# Patient Record
Sex: Male | Born: 1950 | Race: White | Hispanic: No | Marital: Married | Smoking: Never smoker
Health system: Southern US, Community
[De-identification: ages and names within clinical notes are randomized; demographics above are authoritative.]

## PROBLEM LIST (undated history)

## (undated) DIAGNOSIS — I10 Essential (primary) hypertension: Secondary | ICD-10-CM

## (undated) DIAGNOSIS — G5600 Carpal tunnel syndrome, unspecified upper limb: Secondary | ICD-10-CM

## (undated) DIAGNOSIS — E785 Hyperlipidemia, unspecified: Secondary | ICD-10-CM

## (undated) DIAGNOSIS — G4730 Sleep apnea, unspecified: Secondary | ICD-10-CM

## (undated) DIAGNOSIS — M51369 Other intervertebral disc degeneration, lumbar region without mention of lumbar back pain or lower extremity pain: Secondary | ICD-10-CM

## (undated) DIAGNOSIS — N200 Calculus of kidney: Secondary | ICD-10-CM

## (undated) DIAGNOSIS — M5136 Other intervertebral disc degeneration, lumbar region: Secondary | ICD-10-CM

## (undated) DIAGNOSIS — G4733 Obstructive sleep apnea (adult) (pediatric): Secondary | ICD-10-CM

## (undated) DIAGNOSIS — R739 Hyperglycemia, unspecified: Secondary | ICD-10-CM

## (undated) DIAGNOSIS — R972 Elevated prostate specific antigen [PSA]: Secondary | ICD-10-CM

## (undated) HISTORY — PX: CATARACT EXTRACTION: SUR2

## (undated) HISTORY — DX: Obstructive sleep apnea (adult) (pediatric): G47.33

## (undated) HISTORY — PX: CHOLECYSTECTOMY: SHX55

## (undated) HISTORY — PX: SPINE SURGERY: SHX786

## (undated) HISTORY — DX: Elevated prostate specific antigen (PSA): R97.20

## (undated) HISTORY — DX: Sleep apnea, unspecified: G47.30

## (undated) HISTORY — DX: Hyperlipidemia, unspecified: E78.5

## (undated) HISTORY — DX: Other intervertebral disc degeneration, lumbar region without mention of lumbar back pain or lower extremity pain: M51.369

## (undated) HISTORY — DX: Other intervertebral disc degeneration, lumbar region: M51.36

## (undated) HISTORY — DX: Carpal tunnel syndrome, unspecified upper limb: G56.00

## (undated) HISTORY — DX: Calculus of kidney: N20.0

## (undated) HISTORY — PX: HERNIA REPAIR: SHX51

## (undated) HISTORY — DX: Hyperglycemia, unspecified: R73.9

---

## 2007-02-24 ENCOUNTER — Ambulatory Visit: Payer: Self-pay | Admitting: Internal Medicine

## 2007-09-30 ENCOUNTER — Ambulatory Visit: Payer: Self-pay | Admitting: Internal Medicine

## 2010-09-01 ENCOUNTER — Ambulatory Visit: Payer: Self-pay | Admitting: Internal Medicine

## 2015-02-13 ENCOUNTER — Emergency Department
Admission: EM | Admit: 2015-02-13 | Discharge: 2015-02-14 | Disposition: A | Discharge status: Home / Self Care | Payer: Medicare Other | Attending: Emergency Medicine | Admitting: Emergency Medicine

## 2015-02-13 DIAGNOSIS — R55 Syncope and collapse: Secondary | ICD-10-CM | POA: Diagnosis present

## 2015-02-13 DIAGNOSIS — I10 Essential (primary) hypertension: Secondary | ICD-10-CM | POA: Insufficient documentation

## 2015-02-13 DIAGNOSIS — E860 Dehydration: Secondary | ICD-10-CM | POA: Diagnosis not present

## 2015-02-13 HISTORY — DX: Essential (primary) hypertension: I10

## 2015-02-13 LAB — BASIC METABOLIC PANEL
ANION GAP: 9 (ref 5–15)
BUN: 23 mg/dL — ABNORMAL HIGH (ref 6–20)
CALCIUM: 9 mg/dL (ref 8.9–10.3)
CO2: 23 mmol/L (ref 22–32)
Chloride: 108 mmol/L (ref 101–111)
Creatinine, Ser: 1.14 mg/dL (ref 0.61–1.24)
GLUCOSE: 118 mg/dL — AB (ref 65–99)
Potassium: 3.5 mmol/L (ref 3.5–5.1)
SODIUM: 140 mmol/L (ref 135–145)

## 2015-02-13 LAB — CBC
HCT: 40.5 % (ref 40.0–52.0)
Hemoglobin: 13.7 g/dL (ref 13.0–18.0)
MCH: 29.3 pg (ref 26.0–34.0)
MCHC: 33.9 g/dL (ref 32.0–36.0)
MCV: 86.4 fL (ref 80.0–100.0)
PLATELETS: 225 10*3/uL (ref 150–440)
RBC: 4.69 MIL/uL (ref 4.40–5.90)
RDW: 13.3 % (ref 11.5–14.5)
WBC: 8 10*3/uL (ref 3.8–10.6)

## 2015-02-13 LAB — TROPONIN I: Troponin I: <0.03 ng/mL (ref <0.031)

## 2015-02-13 LAB — GLUCOSE, CAPILLARY: Glucose-Capillary: 115 mg/dL — ABNORMAL HIGH (ref 65–99)

## 2015-02-13 MED ORDER — SODIUM CHLORIDE 0.9 % IV BOLUS (SEPSIS)
1000.0000 mL | Freq: Once | INTRAVENOUS | Status: AC
  Administered 2015-02-13: 1000 mL via INTRAVENOUS

## 2015-02-13 NOTE — ED Provider Notes (Addendum)
[facility] Emergency Department Provider Note  ____________________________________________  Time seen: Approximately 9:28 PM  I have reviewed the triage vital signs and the nursing notes.   HISTORY  Chief Complaint Loss of Consciousness  Offered patient a telemetry interpreter, he reports that he is lived in [place] for over 20 years and that he is very comfortable speaking English and does not wish interpreter services. He does seem to have good comprehension. Does appear to speak English quite fluently.  HPI Robert Grimes is a 65 y.o. male history of hypertension on lisinopril.  Patient presents today for evaluation after an episode of syncope. Family reports that the patient passed out at the dining room table briefly. He fell back into his chair and did lose consciousness for about 1-2 minutes, but regained consciousness while seated in the chair without fall or injury. He did not have any weakness except for feeling generally very tired, note droop in the face, no trouble speaking, no numbness weakness in the arms or legs.  The patient reports that he was out in the yard today, he is not staying well-hydrated and felt sweaty during his yard work. He came into the shower and he remembers sitting down and beginning dinner, when he then woke up with family having standing over him evidently hadn't passed out.  He denies any headache, neck pain, chest pain, fever or chills, trouble breathing or abdominal pain or other concerns. In the emergency room he reports that he feels "just fine".  His mother had a heart attack in her 59s, but no history of early onset coronary disease.  Past Medical History  Diagnosis Date  . Hypertension     There are no active problems to display for this patient.   History reviewed. No pertinent past surgical history.  No current outpatient prescriptions on file.  Allergies Review of patient's allergies indicates no  known allergies.  No family history on file.  Social History Social History  Substance Use Topics  . Smoking status: Never Smoker   . Smokeless tobacco: None  . Alcohol Use: No    Review of Systems Constitutional: No fever/chills Eyes: No visual changes. ENT: No sore throat. Cardiovascular: Denies chest pain. Respiratory: Denies shortness of breath. Gastrointestinal: No abdominal pain.  No nausea, no vomiting.  No diarrhea.  No constipation. Genitourinary: Negative for dysuria. Musculoskeletal: Negative for back pain. Skin: Negative for rash. Neurological: Negative for headaches, focal weakness or numbness.  10-point ROS otherwise negative.  ____________________________________________   PHYSICAL EXAM:  VITAL SIGNS: ED Triage Vitals  Enc Vitals Group     BP 02/13/15 1930 125/76 mmHg     Pulse Rate 02/13/15 1930 80     Resp 02/13/15 1930 23     Temp 02/13/15 1921 97.6 F (36.4 C)     Temp src --      SpO2 02/13/15 1930 100 %     Weight 02/13/15 1921 176 lb (79.833 kg)     Height 02/13/15 1921 5\' 3"  (1.6 m)     Head Cir --      Peak Flow --      Pain Score --      Pain Loc --      Pain Edu? --      Excl. in GC? --    Constitutional: Alert and oriented. Well appearing and in no acute distress. Eyes: Conjunctivae are normal. PERRL. EOMI. Head: Atraumatic. Nose: No congestion/rhinnorhea. Mouth/Throat: Mucous membranes are slightly dry.  Oropharynx  non-erythematous. Neck: No stridor.   Cardiovascular: Normal rate, regular rhythm. Grossly normal heart sounds.  Good peripheral circulation. Respiratory: Normal respiratory effort.  No retractions. Lungs CTAB. Gastrointestinal: Soft and nontender. No distention. No abdominal bruits. No CVA tenderness. No midline abdominal or palpable mass. Musculoskeletal: No lower extremity tenderness nor edema.  No joint effusions. Neurologic:  Normal speech and language. No gross focal neurologic deficits are appreciated.    Detailed neuro, performed by me at bedside. The patient has no pronator drift. The patient has normal cranial nerve exam. Extraocular movements are normal. Visual fields are normal. Patient has 5 out of 5 strength in all extremities. There is no numbness or gross, acute sensory abnormality in the extremities bilaterally. No speech disturbance. No dysarthria. No aphasia. No ataxia. Normal finger nose finger bilat. Patient speaking in full and clear sentences.   Skin:  Skin is warm, dry and intact. No rash noted. Psychiatric: Mood and affect are normal. Speech and behavior are normal.  ____________________________________________   LABS (all labs ordered are listed, but only abnormal results are displayed)  Labs Reviewed  BASIC METABOLIC PANEL - Abnormal; Notable for the following:    Glucose, Bld 118 (*)    BUN 23 (*)    All other components within normal limits  GLUCOSE, CAPILLARY - Abnormal; Notable for the following:    Glucose-Capillary 115 (*)    All other components within normal limits  CBC  TROPONIN I  TROPONIN I  CBG MONITORING, ED   ____________________________________________  EKG  ED ECG REPORT I, Capria Cartaya, the attending physician, personally viewed and interpreted this ECG.  Date: 02/13/2015 EKG Time: 1930 Rate: 70 Rhythm: normal sinus rhythm QRS Axis: normal Intervals: normal ST/T Wave abnormalities: normal Conduction Disturbances: none Narrative Interpretation: unremarkable  ____________________________________________  RADIOLOGY  I find no indication for acute imaging. Patient currently asymptomatic, completely intact neuro exam. Normal pulmonary exam, oxygen saturation, and no chest pain. ____________________________________________   PROCEDURES  Procedure(s) performed: None  Critical Care performed: No  ____________________________________________   INITIAL IMPRESSION / ASSESSMENT AND PLAN / ED COURSE  Pertinent labs &  imaging results that were available during my care of the patient were reviewed by me and considered in my medical decision making (see chart for details).  Patient rinse after syncope, with complete and full neurologic recovery. No associated cardiopulmonary symptoms. His EKG is reassuring and first troponin is negative. He is asymptomatic in the ER. History seems most suggestive patient was likely slightly dehydrated after day working in the yard. I find his exam presently to be quite reassuring along with his labs and EKG. No indication for CT imaging of the head. Patient does not meet any high risk syncope criteria by [facility] score.  ----------------------------------------- 10:15 PM on 02/13/2015 -----------------------------------------  Patient is awake alert no distress. He reports asymptomatic, currently drinking water and feeling well. Plan to check a second troponin and observe him based on his age, and plan to discharge him if second troponin is normal and no events or new concerns arise during observation till about midnight. Ongoing care and disposition including follow-up on second troponin and reevaluation assigned to Dr. Derrill Kay at 10:30 PM.  I did discuss return precautions with the patient and his family who are very agreeable the event that his observation period is normal and he is discharged home. ____________________________________________   FINAL CLINICAL IMPRESSION(S) / ED DIAGNOSES  Final diagnoses:  Dehydration  Syncope and collapse      Sharyn Creamer,  MD 02/13/15 2218  ----------------------------------------- 11:38 PM on 02/13/2015 ----------------------------------------- Patient signed out to Dr. Dolores Frame. A third troponin is sent as the second was not appropriately times. If negative, reevaluate and plan to discharge home.   Sharyn Creamer, MD 02/13/15 [phone]

## 2015-02-13 NOTE — Discharge Instructions (Signed)
You have been seen today in the Emergency Department (ED)  for syncope (passing out).  Your workup including labs and EKG show reassuring results.  Your symptoms may be due to dehydration, so it is important that you drink plenty of non-alcoholic fluids.  Please call your regular doctor as soon as possible to schedule the next available clinic appointment to follow up with him/her regarding your visit to the ED and your symptoms.  Return to the Emergency Department (ED)  if you have any further syncopal episodes (pass out again) or develop ANY chest pain, pressure, tightness, trouble breathing, sudden sweating, ANY abdominal pain, or other symptoms that concern you.    Syncope Syncope is a medical term for fainting or passing out. This means you lose consciousness and drop to the ground. People are generally unconscious for less than 5 minutes. You may have some muscle twitches for up to 15 seconds before waking up and returning to normal. Syncope occurs more often in older adults, but it can happen to anyone. While most causes of syncope are not dangerous, syncope can be a sign of a serious medical problem. It is important to seek medical care.  CAUSES  Syncope is caused by a sudden drop in blood flow to the brain. The specific cause is often not determined. Factors that can bring on syncope include:  Taking medicines that lower blood pressure.  Sudden changes in posture, such as standing up quickly.  Taking more medicine than prescribed.  Standing in one place for too long.  Seizure disorders.  Dehydration and excessive exposure to heat.  Low blood sugar (hypoglycemia).  Straining to have a bowel movement.  Heart disease, irregular heartbeat, or other circulatory problems.  Fear, emotional distress, seeing blood, or severe pain. SYMPTOMS  Right before fainting, you may:  Feel dizzy or light-headed.  Feel nauseous.  See all white or all black in your field of vision.  Have  cold, clammy skin. DIAGNOSIS  Your health care provider will ask about your symptoms, perform a physical exam, and perform an electrocardiogram (ECG) to record the electrical activity of your heart. Your health care provider may also perform other heart or blood tests to determine the cause of your syncope which may include:  Transthoracic echocardiogram (TTE). During echocardiography, sound waves are used to evaluate how blood flows through your heart.  Transesophageal echocardiogram (TEE).  Cardiac monitoring. This allows your health care provider to monitor your heart rate and rhythm in real time.  Holter monitor. This is a portable device that records your heartbeat and can help diagnose heart arrhythmias. It allows your health care provider to track your heart activity for several days, if needed.  Stress tests by exercise or by giving medicine that makes the heart beat faster. TREATMENT  In most cases, no treatment is needed. Depending on the cause of your syncope, your health care provider may recommend changing or stopping some of your medicines. HOME CARE INSTRUCTIONS  Have someone stay with you until you feel stable.  Do not drive, use machinery, or play sports until your health care provider says it is okay.  Keep all follow-up appointments as directed by your health care provider.  Lie down right away if you start feeling like you might faint. Breathe deeply and steadily. Wait until all the symptoms have passed.  Drink enough fluids to keep your urine clear or pale yellow.  If you are taking blood pressure or heart medicine, get up slowly and take several  minutes to sit and then stand. This can reduce dizziness. SEEK IMMEDIATE MEDICAL CARE IF:   You have a severe headache.  You have unusual pain in the chest, abdomen, or back.  You are bleeding from your mouth or rectum, or you have black or tarry stool.  You have an irregular or very fast heartbeat.  You have pain  with breathing.  You have repeated fainting or seizure-like jerking during an episode.  You faint when sitting or lying down.  You have confusion.  You have trouble walking.  You have severe weakness.  You have vision problems. If you fainted, call your local emergency services (911 in [place] Do not drive yourself to the hospital.    This information is not intended to replace advice given to you by your health care provider. Make sure you discuss any questions you have with your health care provider.   Document Released: 12/18/2004 Document Revised: 05/04/2014 Document Reviewed: 02/16/2011 Elsevier Interactive Patient Education [employer]

## 2015-02-13 NOTE — ED Notes (Signed)
PT from home via EMS, was outside working all day when we went inside to eat he had a syncopal episode, reports his legs were cramping and had dizziness

## 2015-02-14 LAB — TROPONIN I: TROPONIN I: 0.03 ng/mL (ref <0.031)

## 2015-02-14 NOTE — ED Notes (Signed)
Family at bedside. 

## 2015-02-14 NOTE — ED Notes (Signed)
MD at bedside. 

## 2015-02-14 NOTE — ED Provider Notes (Signed)
-----------------------------------------   12:50 AM on 02/14/2015 -----------------------------------------  Third troponin remains negative. Updated patient and family. Patient to have close follow-up with his PCP within the next 2-3 days. Strict return precautions given. All verbalize understanding and agree with plan of care.  Irean Hong, MD 02/14/15 [phone]

## 2015-02-17 ENCOUNTER — Other Ambulatory Visit: Payer: Self-pay | Admitting: Internal Medicine

## 2015-02-17 DIAGNOSIS — I10 Essential (primary) hypertension: Secondary | ICD-10-CM

## 2015-02-17 DIAGNOSIS — R55 Syncope and collapse: Secondary | ICD-10-CM

## 2015-02-22 ENCOUNTER — Ambulatory Visit
Admission: RE | Admit: 2015-02-22 | Discharge: 2015-02-22 | Disposition: A | Discharge status: Home / Self Care | Payer: Medicare Other | Source: Ambulatory Visit | Attending: Internal Medicine | Admitting: Internal Medicine

## 2015-02-22 DIAGNOSIS — R55 Syncope and collapse: Secondary | ICD-10-CM

## 2015-02-22 DIAGNOSIS — I2510 Atherosclerotic heart disease of native coronary artery without angina pectoris: Secondary | ICD-10-CM | POA: Insufficient documentation

## 2015-02-22 DIAGNOSIS — I708 Atherosclerosis of other arteries: Secondary | ICD-10-CM | POA: Diagnosis not present

## 2015-02-22 DIAGNOSIS — I10 Essential (primary) hypertension: Secondary | ICD-10-CM | POA: Insufficient documentation

## 2016-12-03 ENCOUNTER — Other Ambulatory Visit: Payer: Self-pay

## 2016-12-17 ENCOUNTER — Ambulatory Visit: Payer: Self-pay

## 2017-12-12 IMAGING — US US CAROTID DUPLEX BILAT
1 series · 13 of 24 positions shown · non-contrast
Comparison: None.

CLINICAL DATA: Syncopal episode.  History of hypertension.

EXAM:
BILATERAL CAROTID DUPLEX ULTRASOUND
TECHNIQUE: Gray scale imaging, color Doppler and duplex ultrasound were
performed of bilateral carotid and vertebral arteries in the neck.

[Series 1: us carotid duplex bilat · 13 of 62 slices shown]
[im 1/62]
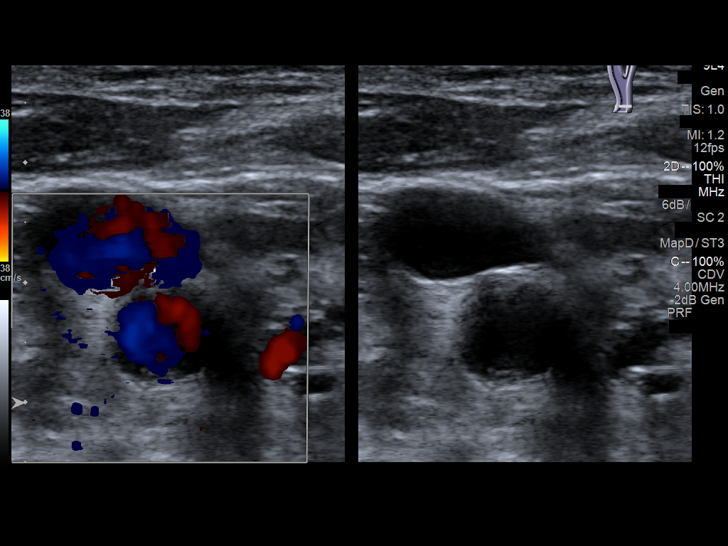
[im 6/62]
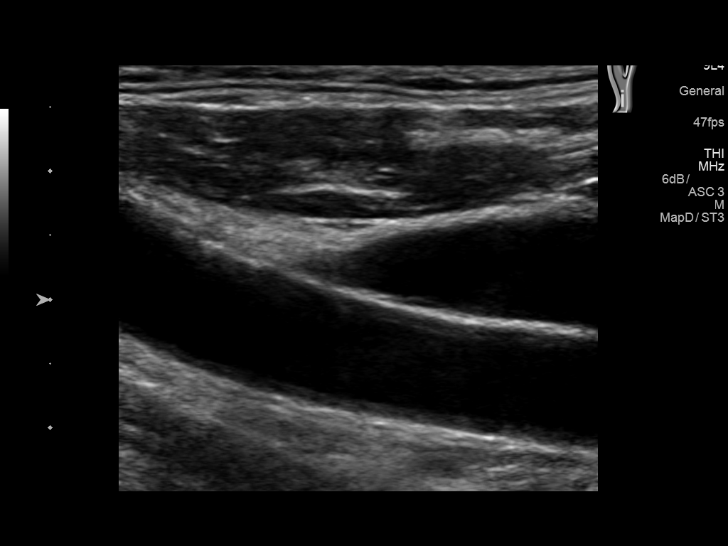
[im 11/62]
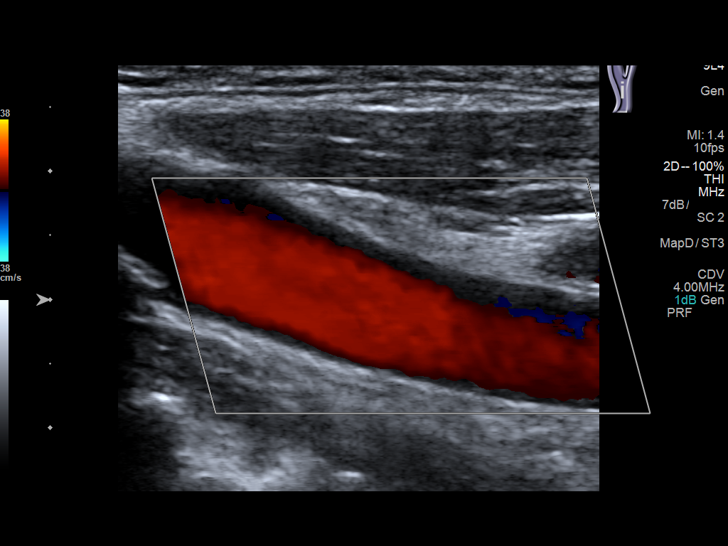
[im 16/62]
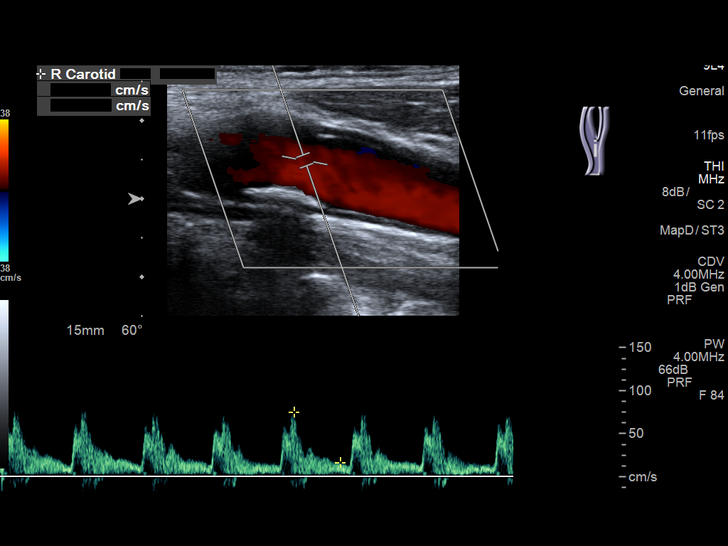
[im 22/62]
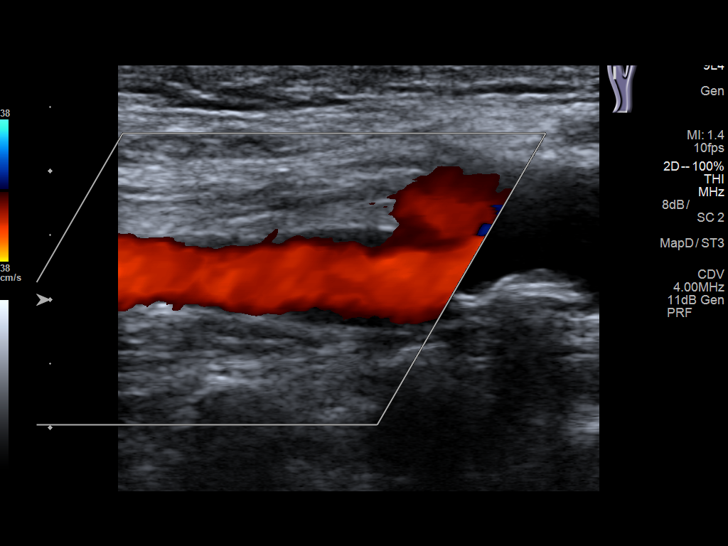
[im 27/62]
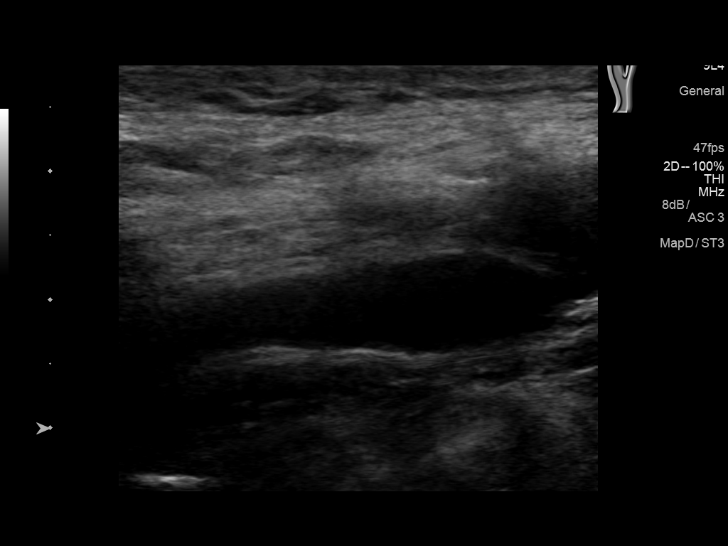
[im 32/62]
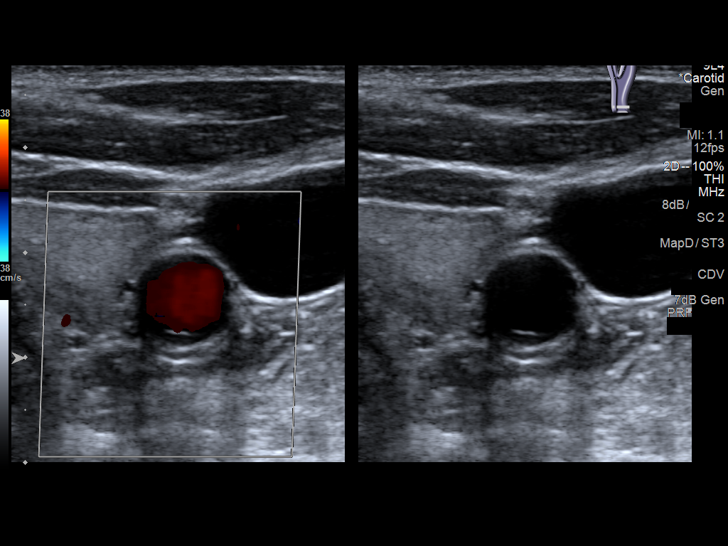
[im 35/62]
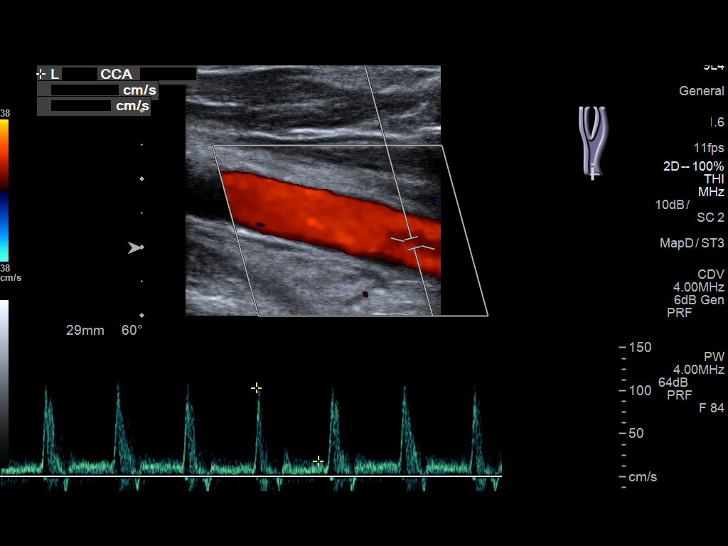
[im 40/62]
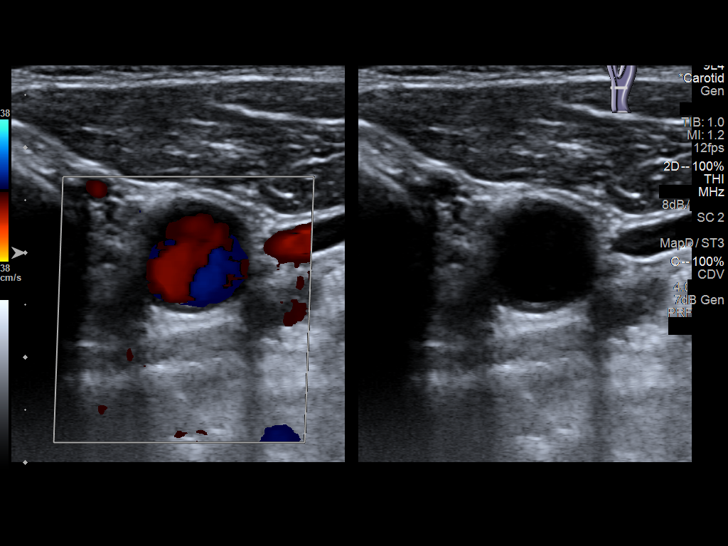
[im 46/62]
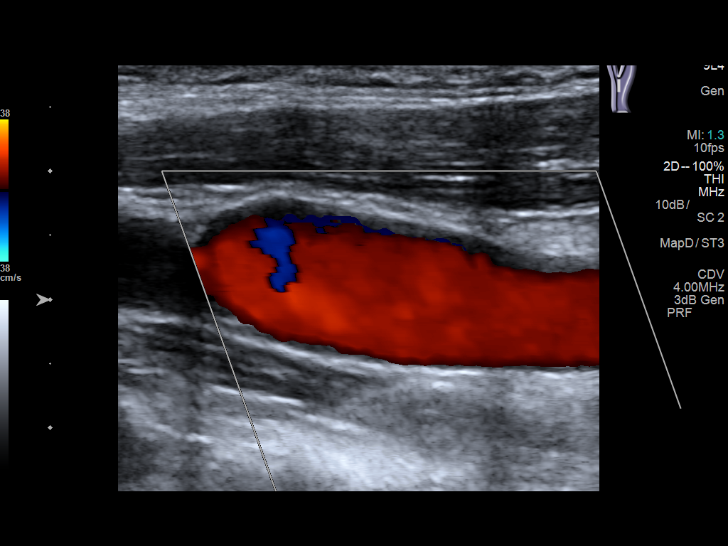
[im 51/62]
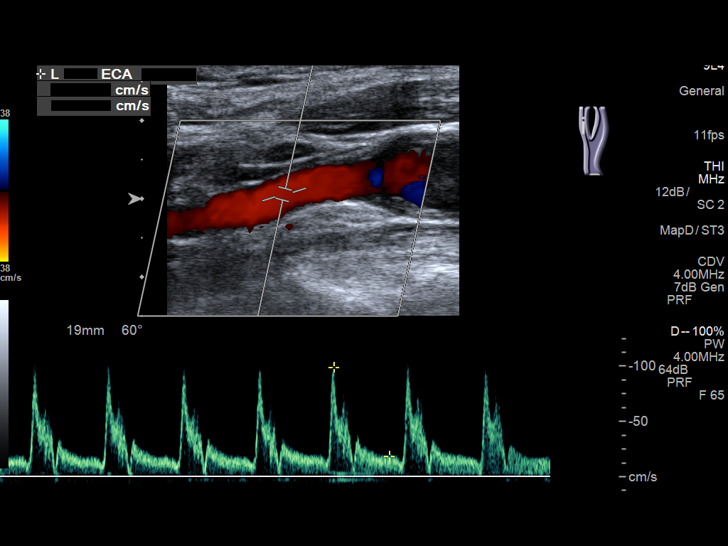
[im 56/62]
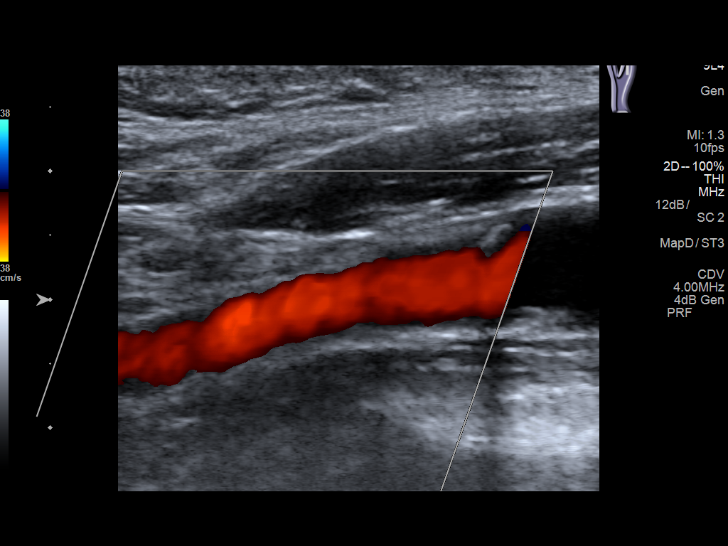
[im 62/62]
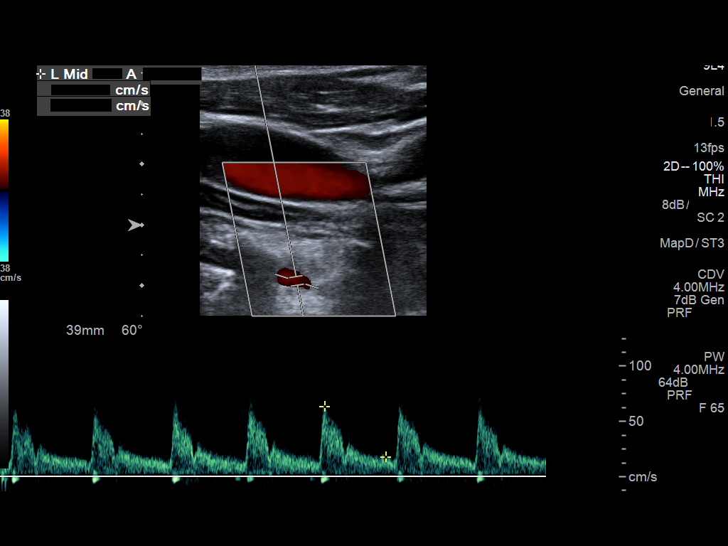

[13 of 24 positions shown; findings below may reference images not displayed]

FINDINGS: Criteria: Quantification of carotid stenosis is based on velocity
parameters that correlate the residual internal carotid diameter
with NASCET-based stenosis levels, using the diameter of the distal
internal carotid lumen as the denominator for stenosis measurement.

The following velocity measurements were obtained:

RIGHT

ICA:  66/23 cm/sec

CCA:  97/17 cm/sec

SYSTOLIC ICA/CCA RATIO:

DIASTOLIC ICA/CCA RATIO:

ECA:  111 cm/sec

LEFT

ICA:  84/32 cm/sec

CCA:  90/21 cm/sec

SYSTOLIC ICA/CCA RATIO:

DIASTOLIC ICA/CCA RATIO:

ECA:  99 cm/sec

RIGHT CAROTID ARTERY: There is a minimal amount of eccentric mixed
echogenic partially shadowing plaque within the right carotid bulb
(images 15 and 16), extending to involve the origin and proximal
aspects of the right internal carotid artery (image 23), not
resulting in elevated peak systolic velocities within the
interrogated course of the right internal carotid artery to suggest
a hemodynamically significant stenosis.

RIGHT VERTEBRAL ARTERY:  Antegrade flow

LEFT CAROTID ARTERY: There is a minimal amount of extended mixed
echogenic plaque within the left carotid bulb (image 46 and 47),
extending to involve the origin and proximal aspects of the left
internal carotid artery (image 54), not resulting in elevated peak
systolic velocities within the interrogated course of the left
internal carotid artery to suggest a hemodynamically significant
stenosis.

LEFT VERTEBRAL ARTERY:  Antegrade flow
IMPRESSION: Minimal amount of bilateral atherosclerotic plaque, right greater
than left, not resulting in a hemodynamically significant stenosis
within either internal carotid artery.

## 2019-01-19 DIAGNOSIS — I10 Essential (primary) hypertension: Secondary | ICD-10-CM | POA: Diagnosis not present

## 2019-02-07 ENCOUNTER — Ambulatory Visit: Payer: Medicare Other | Attending: Internal Medicine

## 2019-02-07 DIAGNOSIS — Z23 Encounter for immunization: Secondary | ICD-10-CM | POA: Insufficient documentation

## 2019-02-07 NOTE — Progress Notes (Signed)
Covid-19 Vaccination Clinic  Name:  Robert Grimes    MRN: [id number] DOB: 05-27-1950  02/07/2019  [name] was observed post Covid-19 immunization for 15 minutes without incidence. He was provided with Vaccine Information Sheet and instruction to access the V-Safe system.   [name] was instructed to call 911 with any severe reactions post vaccine: Marland Kitchen Difficulty breathing  . Swelling of your face and throat  . A fast heartbeat  . A bad rash all over your body  . Dizziness and weakness    Immunizations Administered    Name Date Dose VIS Date Route   Pfizer COVID-19 Vaccine 02/07/2019  1:13 PM 0.3 mL 12/12/2018 Intramuscular   Manufacturer: [employer], [employer]   Lot: [id number]   NDC: [address]-8

## 2019-03-03 ENCOUNTER — Ambulatory Visit: Payer: PPO | Attending: Internal Medicine

## 2019-03-03 DIAGNOSIS — Z23 Encounter for immunization: Secondary | ICD-10-CM | POA: Insufficient documentation

## 2019-03-03 NOTE — Progress Notes (Signed)
Covid-19 Vaccination Clinic  Name:  Robert Grimes    MRN: [id number] DOB: Jan 26, 1950  03/03/2019  [name] was observed post Covid-19 immunization for 15 minutes without incident. He was provided with Vaccine Information Sheet and instruction to access the V-Safe system.   [name] was instructed to call 911 with any severe reactions post vaccine: Marland Kitchen Difficulty breathing  . Swelling of face and throat  . A fast heartbeat  . A bad rash all over body  . Dizziness and weakness   Immunizations Administered    Name Date Dose VIS Date Route   Pfizer COVID-19 Vaccine 03/03/2019 10:39 AM 0.3 mL 12/12/2018 Intramuscular   Manufacturer: [employer], [employer]   Lot: [id number]   NDC: [address]-2

## 2019-03-24 DIAGNOSIS — G4733 Obstructive sleep apnea (adult) (pediatric): Secondary | ICD-10-CM | POA: Diagnosis not present

## 2019-03-24 DIAGNOSIS — R739 Hyperglycemia, unspecified: Secondary | ICD-10-CM | POA: Diagnosis not present

## 2019-03-24 DIAGNOSIS — R972 Elevated prostate specific antigen [PSA]: Secondary | ICD-10-CM | POA: Diagnosis not present

## 2019-03-24 DIAGNOSIS — M5136 Other intervertebral disc degeneration, lumbar region: Secondary | ICD-10-CM | POA: Diagnosis not present

## 2019-03-24 DIAGNOSIS — I10 Essential (primary) hypertension: Secondary | ICD-10-CM | POA: Diagnosis not present

## 2019-03-24 DIAGNOSIS — E7849 Other hyperlipidemia: Secondary | ICD-10-CM | POA: Diagnosis not present

## 2019-03-31 DIAGNOSIS — M5136 Other intervertebral disc degeneration, lumbar region: Secondary | ICD-10-CM | POA: Diagnosis not present

## 2019-03-31 DIAGNOSIS — Z0000 Encounter for general adult medical examination without abnormal findings: Secondary | ICD-10-CM | POA: Diagnosis not present

## 2019-03-31 DIAGNOSIS — R972 Elevated prostate specific antigen [PSA]: Secondary | ICD-10-CM | POA: Diagnosis not present

## 2019-03-31 DIAGNOSIS — I10 Essential (primary) hypertension: Secondary | ICD-10-CM | POA: Diagnosis not present

## 2019-03-31 DIAGNOSIS — G4733 Obstructive sleep apnea (adult) (pediatric): Secondary | ICD-10-CM | POA: Diagnosis not present

## 2019-03-31 DIAGNOSIS — E039 Hypothyroidism, unspecified: Secondary | ICD-10-CM | POA: Diagnosis not present

## 2019-03-31 DIAGNOSIS — E7849 Other hyperlipidemia: Secondary | ICD-10-CM | POA: Diagnosis not present

## 2019-03-31 DIAGNOSIS — R739 Hyperglycemia, unspecified: Secondary | ICD-10-CM | POA: Diagnosis not present

## 2019-04-21 DIAGNOSIS — E039 Hypothyroidism, unspecified: Secondary | ICD-10-CM | POA: Diagnosis not present

## 2019-04-28 DIAGNOSIS — R972 Elevated prostate specific antigen [PSA]: Secondary | ICD-10-CM | POA: Diagnosis not present

## 2019-04-28 DIAGNOSIS — E7849 Other hyperlipidemia: Secondary | ICD-10-CM | POA: Diagnosis not present

## 2019-04-28 DIAGNOSIS — G4733 Obstructive sleep apnea (adult) (pediatric): Secondary | ICD-10-CM | POA: Diagnosis not present

## 2019-04-28 DIAGNOSIS — R739 Hyperglycemia, unspecified: Secondary | ICD-10-CM | POA: Diagnosis not present

## 2019-04-28 DIAGNOSIS — M5136 Other intervertebral disc degeneration, lumbar region: Secondary | ICD-10-CM | POA: Diagnosis not present

## 2019-04-28 DIAGNOSIS — I10 Essential (primary) hypertension: Secondary | ICD-10-CM | POA: Diagnosis not present

## 2019-10-06 DIAGNOSIS — T8522XA Displacement of intraocular lens, initial encounter: Secondary | ICD-10-CM | POA: Diagnosis not present

## 2019-10-06 DIAGNOSIS — H401113 Primary open-angle glaucoma, right eye, severe stage: Secondary | ICD-10-CM | POA: Diagnosis not present

## 2019-10-06 DIAGNOSIS — H401121 Primary open-angle glaucoma, left eye, mild stage: Secondary | ICD-10-CM | POA: Diagnosis not present

## 2019-10-20 DIAGNOSIS — M5136 Other intervertebral disc degeneration, lumbar region: Secondary | ICD-10-CM | POA: Diagnosis not present

## 2019-10-20 DIAGNOSIS — G4733 Obstructive sleep apnea (adult) (pediatric): Secondary | ICD-10-CM | POA: Diagnosis not present

## 2019-10-20 DIAGNOSIS — R972 Elevated prostate specific antigen [PSA]: Secondary | ICD-10-CM | POA: Diagnosis not present

## 2019-10-20 DIAGNOSIS — E7849 Other hyperlipidemia: Secondary | ICD-10-CM | POA: Diagnosis not present

## 2019-10-20 DIAGNOSIS — R739 Hyperglycemia, unspecified: Secondary | ICD-10-CM | POA: Diagnosis not present

## 2019-10-27 DIAGNOSIS — Z125 Encounter for screening for malignant neoplasm of prostate: Secondary | ICD-10-CM | POA: Diagnosis not present

## 2019-10-27 DIAGNOSIS — E7849 Other hyperlipidemia: Secondary | ICD-10-CM | POA: Diagnosis not present

## 2019-10-27 DIAGNOSIS — M5136 Other intervertebral disc degeneration, lumbar region: Secondary | ICD-10-CM | POA: Diagnosis not present

## 2019-10-27 DIAGNOSIS — Z1211 Encounter for screening for malignant neoplasm of colon: Secondary | ICD-10-CM | POA: Diagnosis not present

## 2019-10-27 DIAGNOSIS — G4733 Obstructive sleep apnea (adult) (pediatric): Secondary | ICD-10-CM | POA: Diagnosis not present

## 2019-10-27 DIAGNOSIS — R972 Elevated prostate specific antigen [PSA]: Secondary | ICD-10-CM | POA: Diagnosis not present

## 2019-10-27 DIAGNOSIS — I10 Essential (primary) hypertension: Secondary | ICD-10-CM | POA: Diagnosis not present

## 2019-10-27 DIAGNOSIS — R739 Hyperglycemia, unspecified: Secondary | ICD-10-CM | POA: Diagnosis not present

## 2019-11-16 DIAGNOSIS — H401121 Primary open-angle glaucoma, left eye, mild stage: Secondary | ICD-10-CM | POA: Diagnosis not present

## 2019-11-16 DIAGNOSIS — Z1211 Encounter for screening for malignant neoplasm of colon: Secondary | ICD-10-CM | POA: Diagnosis not present

## 2019-11-16 DIAGNOSIS — H401113 Primary open-angle glaucoma, right eye, severe stage: Secondary | ICD-10-CM | POA: Diagnosis not present

## 2019-11-24 LAB — COLOGUARD: COLOGUARD: NEGATIVE

## 2019-12-14 ENCOUNTER — Other Ambulatory Visit: Payer: Self-pay

## 2019-12-14 ENCOUNTER — Encounter: Payer: Self-pay | Admitting: Urology

## 2019-12-14 ENCOUNTER — Ambulatory Visit: Payer: PPO | Admitting: Urology

## 2019-12-14 VITALS — BP 171/90 | HR 83 | Ht 62.0 in | Wt 166.0 lb

## 2019-12-14 DIAGNOSIS — R350 Frequency of micturition: Secondary | ICD-10-CM

## 2019-12-14 DIAGNOSIS — R972 Elevated prostate specific antigen [PSA]: Secondary | ICD-10-CM | POA: Diagnosis not present

## 2019-12-14 DIAGNOSIS — N401 Enlarged prostate with lower urinary tract symptoms: Secondary | ICD-10-CM | POA: Diagnosis not present

## 2019-12-14 MED ORDER — TAMSULOSIN HCL 0.4 MG PO CAPS: 0.4000 mg | ORAL_CAPSULE | Freq: Every day | ORAL | 2 refills | Status: DC

## 2019-12-14 NOTE — Progress Notes (Signed)
12/14/2019 10:47 AM   Robert Grimes [birth year] [id number]  Referring provider: Lynnea Ferrier, MD [address] [facility] [place],  [place] [address]  Chief Complaint  Patient presents with  . Elevated PSA    HPI: Robert Grimes is a 69 y.o. male seen request of Dr. Graciela Grimes for evaluation of an elevated PSA.   PSA 10/20/2019 was 7.45  I saw him Grimes [facility] 11/2013 for PSA of 5.12 and surveillance recommended  Previous prostate biopsy ~15 years ago which was benign  Does have bothersome urinary frequency  Denies dysuria, gross hematuria  No flank, abdominal or pelvic pain    PMH: Past Medical History:  Diagnosis Date  . Hypertension     Surgical History: No past surgical history on file.  Home Medications:  Allergies as of 12/14/2019   No Known Allergies     Medication List       Accurate as of December 14, 2019 10:47 AM. If you have any questions, ask your nurse or doctor.        amLODipine 2.5 MG tablet Commonly known as: NORVASC Take by mouth.   latanoprost 0.005 % ophthalmic solution Commonly known as: XALATAN   lisinopril 10 MG tablet Commonly known as: ZESTRIL Take 1 tablet by mouth daily.       Allergies: No Known Allergies  Family History: No family history on file.  Social History:  reports that he has never smoked. He has never used smokeless tobacco. He reports that he does not drink alcohol. No history on file for drug use.   Physical Exam: BP (!) 171/90   Pulse 83   Ht 5\' 2"  (1.575 m)   Wt 166 lb (75.3 kg)   BMI 30.36 kg/m   Constitutional:  Alert, No acute distress. HEENT: Robert Grimes, moist mucus membranes.  Trachea midline, no masses. Cardiovascular: No clubbing, cyanosis, or edema. Respiratory: Normal respiratory effort, no increased work of breathing. GI: Abdomen is soft, nontender, nondistended, no abdominal masses GU: Prostate 50 g, smooth without nodules Skin: No rashes, bruises or suspicious  lesions. Neurologic: Grossly intact, no focal deficits, moving all 4 extremities. Psychiatric: Normal mood and affect.   Assessment & Plan:    1.  Elevated PSA  Although PSA is a prostate cancer screening test he was informed that cancer is not the most common cause of an elevated PSA. Other potential causes including BPH and inflammation were discussed. He was informed that the only way to adequately diagnose prostate cancer would be a transrectal ultrasound and biopsy of the prostate. The procedure was discussed including potential risks of bleeding and infection/sepsis. He was also informed that a negative biopsy does not conclusively rule out the possibility that prostate cancer may be present and that continued monitoring is required. The use of newer adjunctive blood tests including PHI and 4kScore were discussed. The use of multiparametric prostate MRI was also discussed however is not typically used for initial evaluation of an elevated PSA. Continued periodic surveillance was also discussed.  Prior negative biopsy and benign DRE.  After discussing above have initially recommended a prostate MRI.  Order was entered and he will be contacted with the results  2.  BPH with urinary frequency  Symptoms are bothersome enough that he desires a trial of medical management  Rx tamsulosin was sent to pharmacy   Robert Altes, MD  [facility] [address] [place], [place] [address] [phone]

## 2019-12-30 ENCOUNTER — Other Ambulatory Visit: Payer: Self-pay | Admitting: Urology

## 2019-12-30 DIAGNOSIS — R972 Elevated prostate specific antigen [PSA]: Secondary | ICD-10-CM

## 2020-01-05 DIAGNOSIS — R109 Unspecified abdominal pain: Secondary | ICD-10-CM | POA: Diagnosis not present

## 2020-01-05 DIAGNOSIS — I10 Essential (primary) hypertension: Secondary | ICD-10-CM | POA: Diagnosis not present

## 2020-01-05 DIAGNOSIS — R55 Syncope and collapse: Secondary | ICD-10-CM | POA: Diagnosis not present

## 2020-01-18 ENCOUNTER — Ambulatory Visit: Payer: PPO

## 2020-02-01 ENCOUNTER — Ambulatory Visit: Payer: PPO

## 2020-02-08 ENCOUNTER — Ambulatory Visit
Admission: RE | Admit: 2020-02-08 | Discharge: 2020-02-08 | Disposition: A | Discharge status: Home / Self Care | Payer: PPO | Source: Ambulatory Visit | Attending: Urology | Admitting: Urology

## 2020-02-08 ENCOUNTER — Other Ambulatory Visit: Payer: Self-pay

## 2020-02-08 DIAGNOSIS — R972 Elevated prostate specific antigen [PSA]: Secondary | ICD-10-CM | POA: Diagnosis not present

## 2020-02-08 DIAGNOSIS — R590 Localized enlarged lymph nodes: Secondary | ICD-10-CM | POA: Diagnosis not present

## 2020-02-08 DIAGNOSIS — N400 Enlarged prostate without lower urinary tract symptoms: Secondary | ICD-10-CM | POA: Diagnosis not present

## 2020-02-08 MED ORDER — GADOBUTROL 1 MMOL/ML IV SOLN
7.0000 mL | Freq: Once | INTRAVENOUS | Status: AC | PRN
  Administered 2020-02-08: 7 mL via INTRAVENOUS

## 2020-02-15 ENCOUNTER — Telehealth: Payer: Self-pay | Admitting: *Deleted

## 2020-02-15 NOTE — Telephone Encounter (Signed)
Notified patient daughter in law as instructed, patient pleased. Discussed follow-up appointments, patient agrees. appt made

## 2020-02-15 NOTE — Telephone Encounter (Signed)
-----   Message from Riki Altes, MD sent at 02/14/2020  4:43 PM EST ----- Prostate MRI showed no lesions suspicious for high-grade prostate cancer.  Recommend lab visit for a repeat PSA

## 2020-02-22 ENCOUNTER — Other Ambulatory Visit
Admission: RE | Admit: 2020-02-22 | Discharge: 2020-02-22 | Disposition: A | Discharge status: Home / Self Care | Payer: PPO | Attending: Urology | Admitting: Urology

## 2020-02-22 ENCOUNTER — Encounter: Payer: Self-pay | Admitting: Urology

## 2020-02-22 ENCOUNTER — Other Ambulatory Visit: Payer: Self-pay

## 2020-02-22 DIAGNOSIS — R350 Frequency of micturition: Secondary | ICD-10-CM | POA: Diagnosis not present

## 2020-02-22 DIAGNOSIS — R972 Elevated prostate specific antigen [PSA]: Secondary | ICD-10-CM | POA: Diagnosis not present

## 2020-02-22 DIAGNOSIS — N401 Enlarged prostate with lower urinary tract symptoms: Secondary | ICD-10-CM | POA: Diagnosis not present

## 2020-02-22 LAB — PSA: Prostatic Specific Antigen: 7.49 ng/mL — ABNORMAL HIGH (ref 0.00–4.00)

## 2020-02-23 ENCOUNTER — Encounter: Payer: Self-pay | Admitting: Urology

## 2020-02-25 DIAGNOSIS — R55 Syncope and collapse: Secondary | ICD-10-CM | POA: Diagnosis not present

## 2020-02-25 DIAGNOSIS — I10 Essential (primary) hypertension: Secondary | ICD-10-CM | POA: Diagnosis not present

## 2020-02-25 DIAGNOSIS — I6523 Occlusion and stenosis of bilateral carotid arteries: Secondary | ICD-10-CM | POA: Diagnosis not present

## 2020-02-25 DIAGNOSIS — E782 Mixed hyperlipidemia: Secondary | ICD-10-CM | POA: Diagnosis not present

## 2020-03-27 ENCOUNTER — Telehealth: Payer: Self-pay | Admitting: Urology

## 2020-03-27 NOTE — Telephone Encounter (Signed)
I sent patient a MyChart message regarding repeat PSA on 02/23/2020 with options for his elevated PSA and there is a notation that it has not been read.  Please verify the message and find out if they have any questions.  Thanks

## 2020-03-29 NOTE — Telephone Encounter (Signed)
Incoming call on triage line from pt's daughter in law, who states that she is returning call. Informed her of the mychart message from Dr. Lonna Cobb per DPR. She gave verbal understanding. She states that she will speak with the patient and call office back with his decision.

## 2020-04-05 ENCOUNTER — Telehealth: Payer: Self-pay | Admitting: *Deleted

## 2020-04-05 NOTE — Telephone Encounter (Signed)
Patient daughter called in today and states her dad will be following up with his PCP for PSA lab work .

## 2020-04-25 DIAGNOSIS — G4733 Obstructive sleep apnea (adult) (pediatric): Secondary | ICD-10-CM | POA: Diagnosis not present

## 2020-04-25 DIAGNOSIS — M5136 Other intervertebral disc degeneration, lumbar region: Secondary | ICD-10-CM | POA: Diagnosis not present

## 2020-04-25 DIAGNOSIS — Z125 Encounter for screening for malignant neoplasm of prostate: Secondary | ICD-10-CM | POA: Diagnosis not present

## 2020-04-25 DIAGNOSIS — R739 Hyperglycemia, unspecified: Secondary | ICD-10-CM | POA: Diagnosis not present

## 2020-04-25 DIAGNOSIS — E7849 Other hyperlipidemia: Secondary | ICD-10-CM | POA: Diagnosis not present

## 2020-05-02 DIAGNOSIS — R972 Elevated prostate specific antigen [PSA]: Secondary | ICD-10-CM | POA: Diagnosis not present

## 2020-05-02 DIAGNOSIS — Z1211 Encounter for screening for malignant neoplasm of colon: Secondary | ICD-10-CM | POA: Diagnosis not present

## 2020-05-02 DIAGNOSIS — R739 Hyperglycemia, unspecified: Secondary | ICD-10-CM | POA: Diagnosis not present

## 2020-05-02 DIAGNOSIS — Z0000 Encounter for general adult medical examination without abnormal findings: Secondary | ICD-10-CM | POA: Diagnosis not present

## 2020-05-02 DIAGNOSIS — I10 Essential (primary) hypertension: Secondary | ICD-10-CM | POA: Diagnosis not present

## 2020-05-02 DIAGNOSIS — E7849 Other hyperlipidemia: Secondary | ICD-10-CM | POA: Diagnosis not present

## 2020-05-02 DIAGNOSIS — M5136 Other intervertebral disc degeneration, lumbar region: Secondary | ICD-10-CM | POA: Diagnosis not present

## 2020-05-02 DIAGNOSIS — G4733 Obstructive sleep apnea (adult) (pediatric): Secondary | ICD-10-CM | POA: Diagnosis not present

## 2020-05-23 DIAGNOSIS — H401113 Primary open-angle glaucoma, right eye, severe stage: Secondary | ICD-10-CM | POA: Diagnosis not present

## 2020-05-23 DIAGNOSIS — H401121 Primary open-angle glaucoma, left eye, mild stage: Secondary | ICD-10-CM | POA: Diagnosis not present

## 2020-10-27 DIAGNOSIS — E7849 Other hyperlipidemia: Secondary | ICD-10-CM | POA: Diagnosis not present

## 2020-10-27 DIAGNOSIS — I10 Essential (primary) hypertension: Secondary | ICD-10-CM | POA: Diagnosis not present

## 2020-10-27 DIAGNOSIS — R739 Hyperglycemia, unspecified: Secondary | ICD-10-CM | POA: Diagnosis not present

## 2020-11-03 DIAGNOSIS — E7849 Other hyperlipidemia: Secondary | ICD-10-CM | POA: Diagnosis not present

## 2020-11-03 DIAGNOSIS — R972 Elevated prostate specific antigen [PSA]: Secondary | ICD-10-CM | POA: Diagnosis not present

## 2020-11-03 DIAGNOSIS — R739 Hyperglycemia, unspecified: Secondary | ICD-10-CM | POA: Diagnosis not present

## 2020-11-03 DIAGNOSIS — M5136 Other intervertebral disc degeneration, lumbar region: Secondary | ICD-10-CM | POA: Diagnosis not present

## 2020-11-03 DIAGNOSIS — I10 Essential (primary) hypertension: Secondary | ICD-10-CM | POA: Diagnosis not present

## 2020-11-28 DIAGNOSIS — H401121 Primary open-angle glaucoma, left eye, mild stage: Secondary | ICD-10-CM | POA: Diagnosis not present

## 2020-11-28 DIAGNOSIS — H401113 Primary open-angle glaucoma, right eye, severe stage: Secondary | ICD-10-CM | POA: Diagnosis not present

## 2021-02-20 DIAGNOSIS — R21 Rash and other nonspecific skin eruption: Secondary | ICD-10-CM | POA: Diagnosis not present

## 2021-02-20 DIAGNOSIS — G4733 Obstructive sleep apnea (adult) (pediatric): Secondary | ICD-10-CM | POA: Diagnosis not present

## 2021-02-20 DIAGNOSIS — I10 Essential (primary) hypertension: Secondary | ICD-10-CM | POA: Diagnosis not present

## 2021-02-20 DIAGNOSIS — E7849 Other hyperlipidemia: Secondary | ICD-10-CM | POA: Diagnosis not present

## 2021-02-20 DIAGNOSIS — R739 Hyperglycemia, unspecified: Secondary | ICD-10-CM | POA: Diagnosis not present

## 2021-02-20 DIAGNOSIS — R972 Elevated prostate specific antigen [PSA]: Secondary | ICD-10-CM | POA: Diagnosis not present

## 2021-02-20 DIAGNOSIS — M5136 Other intervertebral disc degeneration, lumbar region: Secondary | ICD-10-CM | POA: Diagnosis not present

## 2021-03-01 ENCOUNTER — Other Ambulatory Visit: Payer: Self-pay | Admitting: Physical Medicine & Rehabilitation

## 2021-03-01 ENCOUNTER — Other Ambulatory Visit (HOSPITAL_COMMUNITY): Payer: Self-pay | Admitting: Physical Medicine & Rehabilitation

## 2021-03-01 DIAGNOSIS — G8929 Other chronic pain: Secondary | ICD-10-CM | POA: Diagnosis not present

## 2021-03-01 DIAGNOSIS — M5442 Lumbago with sciatica, left side: Secondary | ICD-10-CM | POA: Diagnosis not present

## 2021-03-10 ENCOUNTER — Ambulatory Visit
Admission: RE | Admit: 2021-03-10 | Discharge: 2021-03-10 | Disposition: A | Discharge status: Home / Self Care | Payer: PPO | Source: Ambulatory Visit | Attending: Physical Medicine & Rehabilitation | Admitting: Physical Medicine & Rehabilitation

## 2021-03-10 DIAGNOSIS — M5442 Lumbago with sciatica, left side: Secondary | ICD-10-CM | POA: Diagnosis not present

## 2021-03-10 DIAGNOSIS — G8929 Other chronic pain: Secondary | ICD-10-CM

## 2021-03-10 DIAGNOSIS — M48061 Spinal stenosis, lumbar region without neurogenic claudication: Secondary | ICD-10-CM | POA: Diagnosis not present

## 2021-03-21 DIAGNOSIS — M5442 Lumbago with sciatica, left side: Secondary | ICD-10-CM | POA: Diagnosis not present

## 2021-03-21 DIAGNOSIS — G8929 Other chronic pain: Secondary | ICD-10-CM | POA: Diagnosis not present

## 2021-03-21 DIAGNOSIS — M48062 Spinal stenosis, lumbar region with neurogenic claudication: Secondary | ICD-10-CM | POA: Diagnosis not present

## 2021-03-30 DIAGNOSIS — M48062 Spinal stenosis, lumbar region with neurogenic claudication: Secondary | ICD-10-CM | POA: Diagnosis not present

## 2021-04-27 DIAGNOSIS — E7849 Other hyperlipidemia: Secondary | ICD-10-CM | POA: Diagnosis not present

## 2021-04-27 DIAGNOSIS — R972 Elevated prostate specific antigen [PSA]: Secondary | ICD-10-CM | POA: Diagnosis not present

## 2021-04-27 DIAGNOSIS — M5136 Other intervertebral disc degeneration, lumbar region: Secondary | ICD-10-CM | POA: Diagnosis not present

## 2021-04-27 DIAGNOSIS — R739 Hyperglycemia, unspecified: Secondary | ICD-10-CM | POA: Diagnosis not present

## 2021-05-02 DIAGNOSIS — M48062 Spinal stenosis, lumbar region with neurogenic claudication: Secondary | ICD-10-CM | POA: Diagnosis not present

## 2021-05-02 DIAGNOSIS — M5442 Lumbago with sciatica, left side: Secondary | ICD-10-CM | POA: Diagnosis not present

## 2021-05-02 DIAGNOSIS — G8929 Other chronic pain: Secondary | ICD-10-CM | POA: Diagnosis not present

## 2021-05-04 DIAGNOSIS — G4733 Obstructive sleep apnea (adult) (pediatric): Secondary | ICD-10-CM | POA: Diagnosis not present

## 2021-05-04 DIAGNOSIS — R972 Elevated prostate specific antigen [PSA]: Secondary | ICD-10-CM | POA: Diagnosis not present

## 2021-05-04 DIAGNOSIS — E7849 Other hyperlipidemia: Secondary | ICD-10-CM | POA: Diagnosis not present

## 2021-05-04 DIAGNOSIS — M5136 Other intervertebral disc degeneration, lumbar region: Secondary | ICD-10-CM | POA: Diagnosis not present

## 2021-05-04 DIAGNOSIS — I10 Essential (primary) hypertension: Secondary | ICD-10-CM | POA: Diagnosis not present

## 2021-05-04 DIAGNOSIS — Z0000 Encounter for general adult medical examination without abnormal findings: Secondary | ICD-10-CM | POA: Diagnosis not present

## 2021-05-04 DIAGNOSIS — R739 Hyperglycemia, unspecified: Secondary | ICD-10-CM | POA: Diagnosis not present

## 2021-06-22 DIAGNOSIS — H401113 Primary open-angle glaucoma, right eye, severe stage: Secondary | ICD-10-CM | POA: Diagnosis not present

## 2021-06-22 DIAGNOSIS — H401121 Primary open-angle glaucoma, left eye, mild stage: Secondary | ICD-10-CM | POA: Diagnosis not present

## 2021-07-28 DIAGNOSIS — R739 Hyperglycemia, unspecified: Secondary | ICD-10-CM | POA: Diagnosis not present

## 2021-07-28 DIAGNOSIS — E7849 Other hyperlipidemia: Secondary | ICD-10-CM | POA: Diagnosis not present

## 2021-08-04 DIAGNOSIS — R7303 Prediabetes: Secondary | ICD-10-CM | POA: Diagnosis not present

## 2021-08-04 DIAGNOSIS — M5136 Other intervertebral disc degeneration, lumbar region: Secondary | ICD-10-CM | POA: Diagnosis not present

## 2021-08-04 DIAGNOSIS — G4733 Obstructive sleep apnea (adult) (pediatric): Secondary | ICD-10-CM | POA: Diagnosis not present

## 2021-08-04 DIAGNOSIS — I10 Essential (primary) hypertension: Secondary | ICD-10-CM | POA: Diagnosis not present

## 2021-08-04 DIAGNOSIS — R972 Elevated prostate specific antigen [PSA]: Secondary | ICD-10-CM | POA: Diagnosis not present

## 2021-08-04 DIAGNOSIS — E7849 Other hyperlipidemia: Secondary | ICD-10-CM | POA: Diagnosis not present

## 2021-08-24 DIAGNOSIS — M48062 Spinal stenosis, lumbar region with neurogenic claudication: Secondary | ICD-10-CM | POA: Diagnosis not present

## 2021-09-12 DIAGNOSIS — G8929 Other chronic pain: Secondary | ICD-10-CM | POA: Diagnosis not present

## 2021-09-12 DIAGNOSIS — M5442 Lumbago with sciatica, left side: Secondary | ICD-10-CM | POA: Diagnosis not present

## 2021-09-12 DIAGNOSIS — M48062 Spinal stenosis, lumbar region with neurogenic claudication: Secondary | ICD-10-CM | POA: Diagnosis not present

## 2021-10-03 DIAGNOSIS — G8929 Other chronic pain: Secondary | ICD-10-CM | POA: Diagnosis not present

## 2021-10-03 DIAGNOSIS — M5442 Lumbago with sciatica, left side: Secondary | ICD-10-CM | POA: Diagnosis not present

## 2021-10-10 DIAGNOSIS — G8929 Other chronic pain: Secondary | ICD-10-CM | POA: Diagnosis not present

## 2021-10-10 DIAGNOSIS — M5442 Lumbago with sciatica, left side: Secondary | ICD-10-CM | POA: Diagnosis not present

## 2021-10-24 ENCOUNTER — Other Ambulatory Visit: Payer: Self-pay

## 2021-10-24 ENCOUNTER — Other Ambulatory Visit: Payer: Self-pay | Admitting: Physician Assistant

## 2021-10-24 ENCOUNTER — Emergency Department
Admission: EM | Admit: 2021-10-24 | Discharge: 2021-10-24 | Disposition: A | Discharge status: Home / Self Care | Payer: PPO | Attending: Emergency Medicine | Admitting: Emergency Medicine

## 2021-10-24 ENCOUNTER — Emergency Department
Admission: RE | Admit: 2021-10-24 | Discharge: 2021-10-24 | Disposition: A | Discharge status: Home / Self Care | Payer: PPO | Source: Ambulatory Visit | Attending: Physician Assistant | Admitting: Physician Assistant

## 2021-10-24 ENCOUNTER — Emergency Department: Payer: PPO

## 2021-10-24 DIAGNOSIS — S0990XA Unspecified injury of head, initial encounter: Secondary | ICD-10-CM

## 2021-10-24 DIAGNOSIS — I10 Essential (primary) hypertension: Secondary | ICD-10-CM | POA: Diagnosis not present

## 2021-10-24 DIAGNOSIS — G44209 Tension-type headache, unspecified, not intractable: Secondary | ICD-10-CM | POA: Insufficient documentation

## 2021-10-24 DIAGNOSIS — S065X0A Traumatic subdural hemorrhage without loss of consciousness, initial encounter: Secondary | ICD-10-CM | POA: Diagnosis not present

## 2021-10-24 DIAGNOSIS — S02119A Unspecified fracture of occiput, initial encounter for closed fracture: Secondary | ICD-10-CM | POA: Diagnosis not present

## 2021-10-24 DIAGNOSIS — W268XXA Contact with other sharp object(s), not elsewhere classified, initial encounter: Secondary | ICD-10-CM | POA: Diagnosis not present

## 2021-10-24 DIAGNOSIS — S0101XD Laceration without foreign body of scalp, subsequent encounter: Secondary | ICD-10-CM | POA: Diagnosis not present

## 2021-10-24 DIAGNOSIS — R519 Headache, unspecified: Secondary | ICD-10-CM | POA: Diagnosis not present

## 2021-10-24 DIAGNOSIS — S0003XA Contusion of scalp, initial encounter: Secondary | ICD-10-CM | POA: Diagnosis not present

## 2021-10-24 DIAGNOSIS — S065XAA Traumatic subdural hemorrhage with loss of consciousness status unknown, initial encounter: Secondary | ICD-10-CM

## 2021-10-24 DIAGNOSIS — S0291XA Unspecified fracture of skull, initial encounter for closed fracture: Secondary | ICD-10-CM | POA: Diagnosis not present

## 2021-10-24 DIAGNOSIS — M47812 Spondylosis without myelopathy or radiculopathy, cervical region: Secondary | ICD-10-CM | POA: Diagnosis not present

## 2021-10-24 LAB — CBC WITH DIFFERENTIAL/PLATELET
Abs Immature Granulocytes: 0.02 10*3/uL (ref 0.00–0.07)
Basophils Absolute: 0 10*3/uL (ref 0.0–0.1)
Basophils Relative: 0 %
Eosinophils Absolute: 0.1 10*3/uL (ref 0.0–0.5)
Eosinophils Relative: 1 %
HCT: 43.5 % (ref 39.0–52.0)
Hemoglobin: 14.4 g/dL (ref 13.0–17.0)
Immature Granulocytes: 0 %
Lymphocytes Relative: 20 %
Lymphs Abs: 1.7 10*3/uL (ref 0.7–4.0)
MCH: 29.2 pg (ref 26.0–34.0)
MCHC: 33.1 g/dL (ref 30.0–36.0)
MCV: 88.2 fL (ref 80.0–100.0)
Monocytes Absolute: 0.7 10*3/uL (ref 0.1–1.0)
Monocytes Relative: 8 %
Neutro Abs: 6.1 10*3/uL (ref 1.7–7.7)
Neutrophils Relative %: 71 %
Platelets: 338 10*3/uL (ref 150–400)
RBC: 4.93 MIL/uL (ref 4.22–5.81)
RDW: 12.8 % (ref 11.5–15.5)
WBC: 8.5 10*3/uL (ref 4.0–10.5)
nRBC: 0 % (ref 0.0–0.2)

## 2021-10-24 LAB — COMPREHENSIVE METABOLIC PANEL
ALT: 32 U/L (ref 0–44)
AST: 20 U/L (ref 15–41)
Albumin: 4.2 g/dL (ref 3.5–5.0)
Alkaline Phosphatase: 40 U/L (ref 38–126)
Anion gap: 10 (ref 5–15)
BUN: 16 mg/dL (ref 8–23)
CO2: 27 mmol/L (ref 22–32)
Calcium: 9.5 mg/dL (ref 8.9–10.3)
Chloride: 101 mmol/L (ref 98–111)
Creatinine, Ser: 0.64 mg/dL (ref 0.61–1.24)
GFR, Estimated: >60 mL/min (ref >60)
Glucose, Bld: 109 mg/dL — ABNORMAL HIGH (ref 70–99)
Potassium: 4.1 mmol/L (ref 3.5–5.1)
Sodium: 138 mmol/L (ref 135–145)
Total Bilirubin: 0.9 mg/dL (ref 0.3–1.2)
Total Protein: 8.1 g/dL (ref 6.5–8.1)

## 2021-10-24 LAB — URINALYSIS, ROUTINE W REFLEX MICROSCOPIC
Bilirubin Urine: NEGATIVE
Glucose, UA: NEGATIVE mg/dL
Hgb urine dipstick: NEGATIVE
Ketones, ur: NEGATIVE mg/dL
Leukocytes,Ua: NEGATIVE
Nitrite: NEGATIVE
Protein, ur: NEGATIVE mg/dL
Specific Gravity, Urine: 1.013 (ref 1.005–1.030)
pH: 6 (ref 5.0–8.0)

## 2021-10-24 LAB — PROTIME-INR
INR: 1.2 (ref 0.8–1.2)
Prothrombin Time: 14.7 seconds (ref 11.4–15.2)

## 2021-10-24 LAB — APTT: aPTT: 31 seconds (ref 24–36)

## 2021-10-24 MED ORDER — DEXAMETHASONE 0.5 MG PO TABS: 0.5000 mg | ORAL_TABLET | Freq: Two times a day (BID) | ORAL | 0 refills | Status: AC

## 2021-10-24 NOTE — ED Provider Notes (Signed)
[facility] Provider Note    Event Date/Time   First MD Initiated Contact with Patient 10/24/21 1254     (approximate)   History   Headache   HPI  Robert Grimes is a 71 y.o. male with hypertension, hyperlipidemia, OSA who comes in with concerns for headache on the left side.  Patient recently given amoxicillin for possible sinus infection but had worsening pain.  According to family on 10/15 they went over to his house and they noted some bright red blood on the back of his head.  He denied having a fall and he thought he just cut it on the pillow.  He is not on any blood thinners does not take an aspirin.  He denies having a history of dementia or having difficulty remembering having falls.  Denies any chest pain, shortness of breath, abdominal pain or any other concerns.  Denies any drug use or EtOH use.  Patient CT scan did show subdural collections and occipital fracture..  The CT scan was done 1145  Physical Exam   Triage Vital Signs: ED Triage Vitals  Enc Vitals Group     BP 10/24/21 1243 (!) 148/84     Pulse Rate 10/24/21 1243 71     Resp 10/24/21 1243 17     Temp 10/24/21 1243 97.8 F (36.6 C)     Temp Source 10/24/21 1243 Oral     SpO2 10/24/21 1243 100 %     Weight --      Height --      Head Circumference --      Peak Flow --      Pain Score 10/24/21 1247 4     Pain Loc --      Pain Edu? --      Excl. in [place]? --     Most recent vital signs: Vitals:   10/24/21 1243  BP: (!) 148/84  Pulse: 71  Resp: 17  Temp: 97.8 F (36.6 C)  SpO2: 100%     General: Awake, no distress.  CV:  Good peripheral perfusion.  Resp:  Normal effort.  Abd:  No distention.  Other:  Clear nerves II through XII are intact.  Equal strength in arms and legs.  Sensation intact throughout   ED Results / Procedures / Treatments   Labs (all labs ordered are listed, but only abnormal results are displayed) Labs Reviewed  CBC WITH DIFFERENTIAL/PLATELET   COMPREHENSIVE METABOLIC PANEL  PROTIME-INR  APTT  URINALYSIS, ROUTINE W REFLEX MICROSCOPIC     EKG  My interpretation of EKG:  Normal sinus rhythm 71 without any ST elevation or T wave inversions, normal intervals  RADIOLOGY I have reviewed the CT head personally and there was evidence of intracranial hemorrhage  PROCEDURES:  Critical Care performed: No  .1-3 Lead EKG Interpretation  Performed by: Vanessa Callisburg, MD Authorized by: Vanessa Fall Branch, MD     Interpretation: normal     ECG rate:  70   ECG rate assessment: normal     Rhythm: sinus rhythm     Ectopy: none     Conduction: normal      MEDICATIONS ORDERED IN ED: Medications - No data to display   IMPRESSION / MDM / [place] / ED COURSE  I reviewed the triage vital signs and the nursing notes.   Patient's presentation is most consistent with acute presentation with potential threat to life or bodily function.   Differential is intracranial hemorrhage,  cervical fracture.  I suspect patient most likely had concussion but she does not remember the fall.  Will get labs.  CBC reassuring CMP reassuring.   Discussed with Dr. Cari Caraway recommends a CT head in 6 hours and Decadron 0.5 mg twice daily for 2 weeks versus going to the OR to have it drained the pain on patient's symptoms.  I also verified that we do not need a CT venogram.  Discussed with patient his symptoms are minimal at this time just a nagging headache.  He does not want to go to the OR and have a drain and prefer the conservative steps with the steroids and repeat CT head.  The patient is on the cardiac monitor to evaluate for evidence of arrhythmia and/or significant heart rate changes.   Patient had off to oncoming team pending repeat CT imaging  FINAL CLINICAL IMPRESSION(S) / ED DIAGNOSES   Final diagnoses:  Subdural hematoma (HCC)  Closed fracture of occipital bone, unspecified laterality, unspecified occipital fracture type,  initial encounter ([place])     Rx / DC Orders   ED Discharge Orders          Ordered    dexamethasone (DECADRON) 0.5 MG tablet  2 times daily        10/24/21 1344             Note:  This document was prepared using Dragon voice recognition software and may include unintentional dictation errors.   Vanessa Satsuma, MD 10/24/21 1345

## 2021-10-24 NOTE — ED Notes (Signed)
Pt taken straight to room at this time. No bloodwork order and will let MD place all needed orders

## 2021-10-24 NOTE — ED Notes (Signed)
34 yom with a c/c of left sided headache for the past week. The pt is alert and oriented x 4. The pt is warm, pink, and dry. The pt does have his daughter in law at his bedside. The pt denies any recent illness or injury. The pt was asked to provide a urine but advised he couldn't provide one now.

## 2021-10-24 NOTE — ED Triage Notes (Signed)
Pt comes with c/o intense headaches for over week now.  Pt states no dizziness or known falls. Pt states 4/10 pain. Pt not on thinners. Pt was sent over by doc after CT was performed.   Results were concerning and pot sent over for evaluation.

## 2021-10-24 NOTE — Discharge Instructions (Addendum)
Take the steroids that I prescribed to help with the swelling and bleeding.  Return to the ER if you develop worsening confusion fevers or any other concerns.  You should follow-up with the neurosurgeon in 2 weeks to get repeat CT head to see how things are going.  Do not take ibuprofen, NSAID, aspirin.  He can take Tylenol 1 g every 8 hours.

## 2021-10-24 NOTE — ED Notes (Signed)
The pt was able to stand and pivot with assistance to the wheel chair. The pt was wheeled to room 1. The pt advised he needed to use the restroom. The pt was able to stand and ambulate a short distance to the toilet. The pt was able to urinate without assistance. The pt was able to sit back in the wheel chair without assistance. The pt was wheeled to his bed. The pt was able to stand and pivot with assistance to the stretcher. The pt was placed back on the monitor. The pt and family denied needing any further assistance.

## 2021-10-24 NOTE — ED Provider Notes (Signed)
Procedures     ----------------------------------------- 7:31 PM on 10/24/2021 ----------------------------------------- Repeat CT head and CT cervical spine are both unremarkable.  Symptoms are stable.  Does not require hospitalization and can be discharged as per previous plan by Dr. Nickolas Madrid and Dr. Cari Caraway.     Carrie Mew, MD 10/24/21 1932

## 2021-10-25 ENCOUNTER — Telehealth: Payer: Self-pay

## 2021-10-25 DIAGNOSIS — S065XAA Traumatic subdural hemorrhage with loss of consciousness status unknown, initial encounter: Secondary | ICD-10-CM

## 2021-10-25 NOTE — Telephone Encounter (Signed)
This patient was seen in the ER yesterday for a Closed fracture of the occipital bone and a subdural hematoma. Does patient need a repeat scan? Ok to schedule with you in 3-4 weeks vs.[place]acy?

## 2021-10-25 NOTE — Telephone Encounter (Signed)
Order for repeat head CT has been placed.   Please get him an appt with Dr. Izora Ribas in 2- 3 weeks, ok to overbook per [facility].

## 2021-10-26 NOTE — Telephone Encounter (Signed)
I spoke to his daughter Maudie Mercury, I will call her to schedule f/u once his CT has been scheduled.

## 2021-10-30 NOTE — Telephone Encounter (Signed)
Appt with Dr.Yarbrough on 11/09/21.

## 2021-11-07 ENCOUNTER — Ambulatory Visit
Admission: RE | Admit: 2021-11-07 | Discharge: 2021-11-07 | Disposition: A | Discharge status: Home / Self Care | Payer: PPO | Source: Ambulatory Visit | Attending: Neurosurgery | Admitting: Neurosurgery

## 2021-11-07 DIAGNOSIS — I6200 Nontraumatic subdural hemorrhage, unspecified: Secondary | ICD-10-CM | POA: Diagnosis not present

## 2021-11-07 DIAGNOSIS — R61 Generalized hyperhidrosis: Secondary | ICD-10-CM | POA: Diagnosis not present

## 2021-11-07 DIAGNOSIS — E785 Hyperlipidemia, unspecified: Secondary | ICD-10-CM | POA: Diagnosis present

## 2021-11-07 DIAGNOSIS — G9389 Other specified disorders of brain: Secondary | ICD-10-CM | POA: Diagnosis not present

## 2021-11-07 DIAGNOSIS — G935 Compression of brain: Secondary | ICD-10-CM | POA: Diagnosis present

## 2021-11-07 DIAGNOSIS — S065XAA Traumatic subdural hemorrhage with loss of consciousness status unknown, initial encounter: Secondary | ICD-10-CM | POA: Insufficient documentation

## 2021-11-07 DIAGNOSIS — G4733 Obstructive sleep apnea (adult) (pediatric): Secondary | ICD-10-CM | POA: Diagnosis present

## 2021-11-07 DIAGNOSIS — G4730 Sleep apnea, unspecified: Secondary | ICD-10-CM | POA: Diagnosis not present

## 2021-11-07 DIAGNOSIS — I10 Essential (primary) hypertension: Secondary | ICD-10-CM | POA: Diagnosis present

## 2021-11-07 DIAGNOSIS — R58 Hemorrhage, not elsewhere classified: Secondary | ICD-10-CM | POA: Diagnosis not present

## 2021-11-07 DIAGNOSIS — S065X0A Traumatic subdural hemorrhage without loss of consciousness, initial encounter: Secondary | ICD-10-CM | POA: Diagnosis not present

## 2021-11-07 DIAGNOSIS — W19XXXA Unspecified fall, initial encounter: Secondary | ICD-10-CM | POA: Diagnosis present

## 2021-11-07 DIAGNOSIS — R55 Syncope and collapse: Secondary | ICD-10-CM | POA: Diagnosis present

## 2021-11-07 DIAGNOSIS — S02119A Unspecified fracture of occiput, initial encounter for closed fracture: Secondary | ICD-10-CM | POA: Diagnosis present

## 2021-11-07 DIAGNOSIS — I959 Hypotension, unspecified: Secondary | ICD-10-CM | POA: Diagnosis not present

## 2021-11-07 DIAGNOSIS — Z9889 Other specified postprocedural states: Secondary | ICD-10-CM | POA: Diagnosis not present

## 2021-11-07 DIAGNOSIS — M5136 Other intervertebral disc degeneration, lumbar region: Secondary | ICD-10-CM | POA: Diagnosis present

## 2021-11-08 NOTE — Progress Notes (Unsigned)
Referring Physician:  Lynnea Ferrier, MD [address] [facility] [place],  [place] [address]  Primary Physician:  Lynnea Ferrier, MD  History of Present Illness: 11/08/2021 Robert Grimes is here today with a chief complaint of a subdural hematoma and a occipital bone fracture.  He had an unwitnessed fall last month.  He had a small subdural at the time.  He reports that he is doing well, but his daughter-in-law reports that his thinking is not quite as clear as it was previously.  He is not stable on his feet.  He began using a cane.  He denies headache, dizziness, vision changes.    Past Surgery: lumbar surgery in 2001 at [facility] has no symptoms of cervical myelopathy.  The symptoms are causing a significant impact on the patient's life.   Review of Systems:  A 10 point review of systems is negative, except for the pertinent positives and negatives detailed in the HPI.  Past Medical History: Past Medical History:  Diagnosis Date   Hypertension     Past Surgical History: No past surgical history on file.  Allergies: Allergies as of 11/09/2021   (No Known Allergies)    Medications: No outpatient medications have been marked as taking for the 11/09/21 encounter (Appointment) with Venetia Night, MD.    Social History: Social History   Tobacco Use   Smoking status: Never   Smokeless tobacco: Never  Substance Use Topics   Alcohol use: No    Family Medical History: No family history on file.  Physical Examination: There were no vitals filed for this visit.  General: Patient is well developed, well nourished, calm, collected, and in no apparent distress. Attention to examination is appropriate.  Neck:   Supple.  Full range of motion.  Respiratory: Patient is breathing without any difficulty.   NEUROLOGICAL:     Awake, alert, oriented to person, place, and time.  Speech is clear and fluent. Fund of  knowledge is appropriate.   Cranial Nerves: Pupils equal round and reactive to light.  Facial tone is symmetric.  Facial sensation is symmetric. Shoulder shrug is symmetric. Tongue protrusion is midline.  There is no pronator drift.  ROM of spine: full.    Strength: Side Biceps Triceps Deltoid Interossei Grip Wrist Ext. Wrist Flex.  R 5 5 5 5 5 5 5   L 5 5 5 5 5 5 5    Side Iliopsoas Quads Hamstring PF DF EHL  R 5 5 5 5 5 5   L 5 5 5 5 5 5    Reflexes are 3+ and symmetric at the biceps, triceps, brachioradialis, patella and achilles.   Hoffman's is present on the right.   Bilateral upper and lower extremity sensation is intact to light touch.    No evidence of dysmetria noted.  Gait is abnormal and requires a cane.     Medical Decision Making  Imaging: CT Head 11/07/21 IMPRESSION: 1. In comparison to CT head from October 24, 2021, interval increase in size of subdural hematomas bilaterally (measuring up to 1.5 cm in thickness on the left and 1 cm on the right). Areas of internal hyperdensity bilaterally are compatible with acute/recent hemorrhage. 2. Similar versus slightly increased rightward midline shift, measuring 5 mm at the foramen of Monroe.   These results will be called to the ordering clinician or representative by the Radiologist Assistant, and communication documented in the PACS or .   Electronically  Signed: By: Feliberto Harts M.D. On: 11/07/2021 15:49  I have personally reviewed the images and agree with the above interpretation.  Assessment and Plan: Robert Grimes is a pleasant 71 y.o. male with worsening subdural hematomas.  I think the left-sided subdural hematoma is more problematic than the right.  Given its increase in size over the past 2 weeks, I recommended intervention.  We discussed middle meningeal artery embolization versus bur hole for subdural drainage.  I recommended bur hole for subdural drainage.  During our discussion,  Robert Grimes became very faint.  He began feeling unwell.  He then began to lose consciousness.  His daughter-in-law and I transferred him to a more secure chair.  His vitals were checked and he had a very thready pulse.  He became very diaphoretic.  We then called 911.  #1 then came to evaluate him.  He did begin to improve while awaiting EMS.  I suspect he will need to be evaluated in the emergency department.  After that evaluation, we will determine whether to admit him and perform bur hole drainage tomorrow versus scheduling for next week.   I spent a total of 45 minutes in face-to-face and non-face-to-face activities related to this patient's care today.  Thank you for involving me in the care of this patient.      Maclaine Ahola K. Myer Haff MD, [facility] Neurosurgery

## 2021-11-09 ENCOUNTER — Encounter
Admission: EM | Disposition: A | Discharge status: Home / Self Care | Payer: Self-pay | Source: Home / Self Care | Attending: Neurosurgery

## 2021-11-09 ENCOUNTER — Other Ambulatory Visit: Payer: Self-pay

## 2021-11-09 ENCOUNTER — Inpatient Hospital Stay
Admission: EM | Admit: 2021-11-09 | Discharge: 2021-11-11 | DRG: 025 | Disposition: A | Discharge status: Home / Self Care | Payer: PPO | Source: Ambulatory Visit | Attending: Neurosurgery | Admitting: Neurosurgery

## 2021-11-09 ENCOUNTER — Ambulatory Visit: Payer: PPO | Admitting: Neurosurgery

## 2021-11-09 ENCOUNTER — Encounter: Payer: Self-pay | Admitting: Neurosurgery

## 2021-11-09 ENCOUNTER — Telehealth: Payer: Self-pay

## 2021-11-09 ENCOUNTER — Emergency Department: Payer: PPO | Admitting: Certified Registered"

## 2021-11-09 ENCOUNTER — Emergency Department: Payer: PPO

## 2021-11-09 VITALS — BP 138/86 | HR 68 | Ht 63.0 in | Wt 161.8 lb

## 2021-11-09 DIAGNOSIS — R55 Syncope and collapse: Secondary | ICD-10-CM

## 2021-11-09 DIAGNOSIS — W19XXXA Unspecified fall, initial encounter: Secondary | ICD-10-CM | POA: Diagnosis present

## 2021-11-09 DIAGNOSIS — E785 Hyperlipidemia, unspecified: Secondary | ICD-10-CM | POA: Diagnosis present

## 2021-11-09 DIAGNOSIS — S065XAA Traumatic subdural hemorrhage with loss of consciousness status unknown, initial encounter: Principal | ICD-10-CM | POA: Diagnosis present

## 2021-11-09 DIAGNOSIS — S02119A Unspecified fracture of occiput, initial encounter for closed fracture: Secondary | ICD-10-CM | POA: Diagnosis present

## 2021-11-09 DIAGNOSIS — M5136 Other intervertebral disc degeneration, lumbar region: Secondary | ICD-10-CM | POA: Diagnosis present

## 2021-11-09 DIAGNOSIS — G935 Compression of brain: Secondary | ICD-10-CM | POA: Diagnosis present

## 2021-11-09 DIAGNOSIS — G4733 Obstructive sleep apnea (adult) (pediatric): Secondary | ICD-10-CM | POA: Diagnosis present

## 2021-11-09 DIAGNOSIS — I10 Essential (primary) hypertension: Secondary | ICD-10-CM | POA: Diagnosis present

## 2021-11-09 HISTORY — PX: BURR HOLE: SHX908

## 2021-11-09 LAB — CBC WITH DIFFERENTIAL/PLATELET
Abs Immature Granulocytes: 0.02 10*3/uL (ref 0.00–0.07)
Basophils Absolute: 0 10*3/uL (ref 0.0–0.1)
Basophils Relative: 1 %
Eosinophils Absolute: 0.1 10*3/uL (ref 0.0–0.5)
Eosinophils Relative: 1 %
HCT: 39.8 % (ref 39.0–52.0)
Hemoglobin: 13.4 g/dL (ref 13.0–17.0)
Immature Granulocytes: 0 %
Lymphocytes Relative: 26 %
Lymphs Abs: 2.1 10*3/uL (ref 0.7–4.0)
MCH: 29.9 pg (ref 26.0–34.0)
MCHC: 33.7 g/dL (ref 30.0–36.0)
MCV: 88.8 fL (ref 80.0–100.0)
Monocytes Absolute: 0.7 10*3/uL (ref 0.1–1.0)
Monocytes Relative: 8 %
Neutro Abs: 5.3 10*3/uL (ref 1.7–7.7)
Neutrophils Relative %: 64 %
Platelets: 232 10*3/uL (ref 150–400)
RBC: 4.48 MIL/uL (ref 4.22–5.81)
RDW: 13.6 % (ref 11.5–15.5)
WBC: 8.2 10*3/uL (ref 4.0–10.5)
nRBC: 0 % (ref 0.0–0.2)

## 2021-11-09 LAB — BASIC METABOLIC PANEL
Anion gap: 10 (ref 5–15)
BUN: 21 mg/dL (ref 8–23)
CO2: 25 mmol/L (ref 22–32)
Calcium: 9 mg/dL (ref 8.9–10.3)
Chloride: 104 mmol/L (ref 98–111)
Creatinine, Ser: 0.68 mg/dL (ref 0.61–1.24)
GFR, Estimated: >60 mL/min (ref >60)
Glucose, Bld: 113 mg/dL — ABNORMAL HIGH (ref 70–99)
Potassium: 3.2 mmol/L — ABNORMAL LOW (ref 3.5–5.1)
Sodium: 139 mmol/L (ref 135–145)

## 2021-11-09 LAB — GLUCOSE, CAPILLARY: Glucose-Capillary: 119 mg/dL — ABNORMAL HIGH (ref 70–99)

## 2021-11-09 SURGERY — CREATION, CRANIAL BURR HOLE
Anesthesia: General | Laterality: Left

## 2021-11-09 MED ORDER — PANTOPRAZOLE SODIUM 40 MG IV SOLR
40.0000 mg | Freq: Every day | INTRAVENOUS | Status: DC
  Administered 2021-11-09 – 2021-11-10 (×2): 40 mg via INTRAVENOUS
  Filled 2021-11-09 (×2): qty 10

## 2021-11-09 MED ORDER — OXYCODONE HCL 5 MG PO TABS: 5.0000 mg | ORAL_TABLET | Freq: Once | ORAL | Status: DC | PRN

## 2021-11-09 MED ORDER — LISINOPRIL-HYDROCHLOROTHIAZIDE 20-12.5 MG PO TABS: 1.0000 | ORAL_TABLET | Freq: Every day | ORAL | Status: DC

## 2021-11-09 MED ORDER — DEXAMETHASONE SODIUM PHOSPHATE 10 MG/ML IJ SOLN
INTRAMUSCULAR | Status: DC | PRN
  Administered 2021-11-09: 4 mg via INTRAVENOUS

## 2021-11-09 MED ORDER — CEFAZOLIN SODIUM-DEXTROSE 2-4 GM/100ML-% IV SOLN
2.0000 g | Freq: Once | INTRAVENOUS | Status: AC
  Administered 2021-11-09: 2 g via INTRAVENOUS

## 2021-11-09 MED ORDER — FENTANYL CITRATE (PF) 100 MCG/2ML IJ SOLN
INTRAMUSCULAR | Status: AC
  Filled 2021-11-09: qty 2

## 2021-11-09 MED ORDER — ACETAMINOPHEN 325 MG PO TABS
650.0000 mg | ORAL_TABLET | ORAL | Status: DC | PRN
  Administered 2021-11-09 – 2021-11-11 (×7): 650 mg via ORAL
  Filled 2021-11-09 (×7): qty 2

## 2021-11-09 MED ORDER — HYDROCHLOROTHIAZIDE 12.5 MG PO TABS
12.5000 mg | ORAL_TABLET | Freq: Every day | ORAL | Status: DC
  Administered 2021-11-10 – 2021-11-11 (×2): 12.5 mg via ORAL
  Filled 2021-11-09 (×2): qty 1

## 2021-11-09 MED ORDER — ACETAMINOPHEN 650 MG RE SUPP: 650.0000 mg | RECTAL | Status: DC | PRN

## 2021-11-09 MED ORDER — POTASSIUM CHLORIDE IN NACL 20-0.9 MEQ/L-% IV SOLN
INTRAVENOUS | Status: DC
  Filled 2021-11-09 (×2): qty 1000

## 2021-11-09 MED ORDER — GELATIN ABSORBABLE 100 CM EX MISC
CUTANEOUS | Status: AC
  Filled 2021-11-09: qty 1

## 2021-11-09 MED ORDER — PROPOFOL 10 MG/ML IV BOLUS
INTRAVENOUS | Status: AC
  Filled 2021-11-09: qty 20

## 2021-11-09 MED ORDER — LIDOCAINE-EPINEPHRINE 1 %-1:100000 IJ SOLN
INTRAMUSCULAR | Status: AC
  Filled 2021-11-09: qty 1

## 2021-11-09 MED ORDER — PHENYLEPHRINE HCL (PRESSORS) 10 MG/ML IV SOLN
INTRAVENOUS | Status: AC
  Filled 2021-11-09: qty 1

## 2021-11-09 MED ORDER — PROMETHAZINE HCL 25 MG PO TABS: 12.5000 mg | ORAL_TABLET | ORAL | Status: DC | PRN

## 2021-11-09 MED ORDER — ROCURONIUM BROMIDE 100 MG/10ML IV SOLN
INTRAVENOUS | Status: DC | PRN
  Administered 2021-11-09: 40 mg via INTRAVENOUS
  Administered 2021-11-09: 20 mg via INTRAVENOUS

## 2021-11-09 MED ORDER — CHLORHEXIDINE GLUCONATE 0.12 % MT SOLN
OROMUCOSAL | Status: AC
  Administered 2021-11-09: 15 mL via OROMUCOSAL
  Filled 2021-11-09: qty 15

## 2021-11-09 MED ORDER — LISINOPRIL 20 MG PO TABS
20.0000 mg | ORAL_TABLET | Freq: Every day | ORAL | Status: DC
  Administered 2021-11-10 – 2021-11-11 (×2): 20 mg via ORAL
  Filled 2021-11-09: qty 1
  Filled 2021-11-09: qty 4

## 2021-11-09 MED ORDER — PROPOFOL 500 MG/50ML IV EMUL
INTRAVENOUS | Status: DC | PRN
  Administered 2021-11-09: 40 ug/kg/min via INTRAVENOUS

## 2021-11-09 MED ORDER — PHENYLEPHRINE HCL-NACL 20-0.9 MG/250ML-% IV SOLN
INTRAVENOUS | Status: DC | PRN
  Administered 2021-11-09: 15 ug/min via INTRAVENOUS

## 2021-11-09 MED ORDER — FENTANYL CITRATE (PF) 100 MCG/2ML IJ SOLN
INTRAMUSCULAR | Status: DC | PRN
  Administered 2021-11-09: 25 ug via INTRAVENOUS

## 2021-11-09 MED ORDER — GLYCOPYRROLATE 0.2 MG/ML IJ SOLN
INTRAMUSCULAR | Status: DC | PRN
  Administered 2021-11-09: .2 mg via INTRAVENOUS

## 2021-11-09 MED ORDER — PROPOFOL 10 MG/ML IV BOLUS
INTRAVENOUS | Status: DC | PRN
  Administered 2021-11-09 (×2): 20 mg via INTRAVENOUS
  Administered 2021-11-09: 100 mg via INTRAVENOUS

## 2021-11-09 MED ORDER — DOCUSATE SODIUM 100 MG PO CAPS
100.0000 mg | ORAL_CAPSULE | Freq: Two times a day (BID) | ORAL | Status: DC
  Administered 2021-11-09 – 2021-11-11 (×4): 100 mg via ORAL
  Filled 2021-11-09 (×4): qty 1

## 2021-11-09 MED ORDER — SUGAMMADEX SODIUM 200 MG/2ML IV SOLN
INTRAVENOUS | Status: DC | PRN
  Administered 2021-11-09 (×2): 100 mg via INTRAVENOUS

## 2021-11-09 MED ORDER — OXYCODONE HCL 5 MG/5ML PO SOLN: 5.0000 mg | Freq: Once | ORAL | Status: DC | PRN

## 2021-11-09 MED ORDER — CHLORHEXIDINE GLUCONATE 0.12 % MT SOLN: 15.0000 mL | Freq: Once | OROMUCOSAL | Status: AC

## 2021-11-09 MED ORDER — CEFAZOLIN SODIUM-DEXTROSE 2-4 GM/100ML-% IV SOLN
INTRAVENOUS | Status: AC
  Filled 2021-11-09: qty 100

## 2021-11-09 MED ORDER — ONDANSETRON HCL 4 MG/2ML IJ SOLN: 4.0000 mg | INTRAMUSCULAR | Status: DC | PRN

## 2021-11-09 MED ORDER — PHENYLEPHRINE HCL (PRESSORS) 10 MG/ML IV SOLN
INTRAVENOUS | Status: DC | PRN
  Administered 2021-11-09: 160 ug via INTRAVENOUS

## 2021-11-09 MED ORDER — MIDAZOLAM HCL 2 MG/2ML IJ SOLN
INTRAMUSCULAR | Status: AC
  Filled 2021-11-09: qty 2

## 2021-11-09 MED ORDER — FLEET ENEMA 7-19 GM/118ML RE ENEM: 1.0000 | ENEMA | Freq: Once | RECTAL | Status: DC | PRN

## 2021-11-09 MED ORDER — SURGIFLO WITH THROMBIN (HEMOSTATIC MATRIX KIT) OPTIME
TOPICAL | Status: DC | PRN
  Administered 2021-11-09: 1 via TOPICAL

## 2021-11-09 MED ORDER — LIDOCAINE-EPINEPHRINE 1 %-1:100000 IJ SOLN
INTRAMUSCULAR | Status: DC | PRN
  Administered 2021-11-09: 3 mL

## 2021-11-09 MED ORDER — LACTATED RINGERS IV SOLN: INTRAVENOUS | Status: DC

## 2021-11-09 MED ORDER — EPHEDRINE SULFATE (PRESSORS) 50 MG/ML IJ SOLN
INTRAMUSCULAR | Status: DC | PRN
  Administered 2021-11-09: 5 mg via INTRAVENOUS

## 2021-11-09 MED ORDER — BISACODYL 5 MG PO TBEC: 5.0000 mg | DELAYED_RELEASE_TABLET | Freq: Every day | ORAL | Status: DC | PRN

## 2021-11-09 MED ORDER — LIDOCAINE HCL (CARDIAC) PF 100 MG/5ML IV SOSY
PREFILLED_SYRINGE | INTRAVENOUS | Status: DC | PRN
  Administered 2021-11-09: 80 mg via INTRAVENOUS

## 2021-11-09 MED ORDER — SUCCINYLCHOLINE CHLORIDE 200 MG/10ML IV SOSY
PREFILLED_SYRINGE | INTRAVENOUS | Status: DC | PRN
  Administered 2021-11-09: 140 mg via INTRAVENOUS

## 2021-11-09 MED ORDER — ONDANSETRON HCL 4 MG PO TABS: 4.0000 mg | ORAL_TABLET | ORAL | Status: DC | PRN

## 2021-11-09 MED ORDER — ORAL CARE MOUTH RINSE: 15.0000 mL | Freq: Once | OROMUCOSAL | Status: AC

## 2021-11-09 MED ORDER — HYDROCODONE-ACETAMINOPHEN 5-325 MG PO TABS: 1.0000 | ORAL_TABLET | ORAL | Status: DC | PRN

## 2021-11-09 MED ORDER — ONDANSETRON HCL 4 MG/2ML IJ SOLN
INTRAMUSCULAR | Status: DC | PRN
  Administered 2021-11-09: 4 mg via INTRAVENOUS

## 2021-11-09 MED ORDER — FENTANYL CITRATE (PF) 100 MCG/2ML IJ SOLN: 25.0000 ug | INTRAMUSCULAR | Status: DC | PRN

## 2021-11-09 MED ORDER — LABETALOL HCL 5 MG/ML IV SOLN: 10.0000 mg | INTRAVENOUS | Status: DC | PRN

## 2021-11-09 SURGICAL SUPPLY — 66 items
APPLICATOR CHLORAPREP 10.5 ORG (MISCELLANEOUS) ×1 IMPLANT
BENZOIN TINCTURE PRP APPL 2/3 (GAUZE/BANDAGES/DRESSINGS) IMPLANT
BLADE CLIPPER SPEC (BLADE) ×1 IMPLANT
BUR ACORN 7.5 PRECISION (BURR) ×1 IMPLANT
CATH ROBINSON RED A/P 10FR (CATHETERS) IMPLANT
COUNTER NEEDLE 20/40 LG (NEEDLE) ×1 IMPLANT
COVER LIGHT HANDLE STERIS (MISCELLANEOUS) ×2 IMPLANT
DRAIN MONITOR EXT NEURO DUET (DRAIN) IMPLANT
DRAPE SURG 17X11 SM STRL (DRAPES) ×4 IMPLANT
DRAPE WARM FLUID 44X44 (DRAPES) ×1 IMPLANT
DRSG TEGADERM 4X4.75 (GAUZE/BANDAGES/DRESSINGS) IMPLANT
DRSG TELFA 3X4 N-ADH STERILE (GAUZE/BANDAGES/DRESSINGS) ×1 IMPLANT
ELECT CAUTERY BLADE TIP 2.5 (TIP) ×1
ELECT REM PT RETURN 9FT ADLT (ELECTROSURGICAL) ×1
ELECTRODE CAUTERY BLDE TIP 2.5 (TIP) ×1 IMPLANT
ELECTRODE REM PT RTRN 9FT ADLT (ELECTROSURGICAL) ×1 IMPLANT
GAUZE 4X4 16PLY ~~LOC~~+RFID DBL (SPONGE) ×3 IMPLANT
GAUZE SPONGE 4X4 12PLY STRL (GAUZE/BANDAGES/DRESSINGS) ×4 IMPLANT
GAUZE XEROFORM 1X8 LF (GAUZE/BANDAGES/DRESSINGS) IMPLANT
GAUZE XEROFORM 5X9 LF (GAUZE/BANDAGES/DRESSINGS) IMPLANT
GLOVE BIOGEL PI IND STRL 6.5 (GLOVE) ×1 IMPLANT
GLOVE SURG SYN 6.5 ES PF (GLOVE) ×1 IMPLANT
GLOVE SURG SYN 6.5 PF PI (GLOVE) ×1 IMPLANT
GLOVE SURG SYN 8.5  E (GLOVE) ×6
GLOVE SURG SYN 8.5 E (GLOVE) ×6 IMPLANT
GLOVE SURG SYN 8.5 PF PI (GLOVE) ×6 IMPLANT
GOWN SRG LRG LVL 4 IMPRV REINF (GOWNS) ×1 IMPLANT
GOWN SRG XL LVL 3 NONREINFORCE (GOWNS) ×1 IMPLANT
GOWN STRL NON-REIN TWL XL LVL3 (GOWNS) ×1
GOWN STRL REIN LRG LVL4 (GOWNS) ×1
GRADUATE 1200CC STRL 31836 (MISCELLANEOUS) ×1 IMPLANT
HOLDER FOLEY CATH W/STRAP (MISCELLANEOUS) IMPLANT
KIT DRAIN CSF ACCUDRAIN (MISCELLANEOUS) ×1 IMPLANT
KIT TURNOVER KIT A (KITS) ×1 IMPLANT
MANIFOLD NEPTUNE II (INSTRUMENTS) ×1 IMPLANT
MARKER SKIN DUAL TIP RULER LAB (MISCELLANEOUS) ×2 IMPLANT
MAT ABSORB  FLUID 56X50 GRAY (MISCELLANEOUS) ×1
MAT ABSORB FLUID 56X50 GRAY (MISCELLANEOUS) ×1 IMPLANT
NDL HYPO 25GX1X1/2 BEV (NEEDLE) ×1 IMPLANT
NEEDLE HYPO 25GX1X1/2 BEV (NEEDLE) ×1 IMPLANT
PACK LAMINECTOMY NEURO (CUSTOM PROCEDURE TRAY) ×1 IMPLANT
PAD ARMBOARD 7.5X6 YLW CONV (MISCELLANEOUS) ×2 IMPLANT
PAD PREP 24X41 OB/GYN DISP (PERSONAL CARE ITEMS) ×1 IMPLANT
PIN MAYFIELD SKULL DISP (PIN) IMPLANT
SCRUB TECHNI CARE 4 OZ NO DYE (MISCELLANEOUS) ×1 IMPLANT
SET CATH VENT DRAIN 3-15 1.9D (DRAIN) ×1 IMPLANT
SHEET NEURO XL SOL CTL (MISCELLANEOUS) ×1 IMPLANT
SOL PREP PVP 2OZ (MISCELLANEOUS) ×1
SOLUTION IRRIG SURGIPHOR (IV SOLUTION) ×1 IMPLANT
SOLUTION PREP PVP 2OZ (MISCELLANEOUS) ×1 IMPLANT
STAPLER SKIN PROX 35W (STAPLE) ×2 IMPLANT
STRIP CLOSURE SKIN 1/2X4 (GAUZE/BANDAGES/DRESSINGS) ×1 IMPLANT
SURGIFLO W/THROMBIN 8M KIT (HEMOSTASIS) IMPLANT
SURGILUBE 2OZ TUBE FLIPTOP (MISCELLANEOUS) IMPLANT
SUT MNCRL AB 3-0 PS2 27 (SUTURE) ×1 IMPLANT
SUT SILK 2 0 SH CR/8 (SUTURE) ×1 IMPLANT
SUT VIC AB 3-0 SH 8-18 (SUTURE) ×1 IMPLANT
SYR 10ML LL (SYRINGE) ×2 IMPLANT
TAPE CLOTH 3X10 WHT NS LF (GAUZE/BANDAGES/DRESSINGS) IMPLANT
TAPE HY-TAPE .5X5Y PINK LF (GAUZE/BANDAGES/DRESSINGS) ×1 IMPLANT
TOWEL OR 17X26 4PK STRL BLUE (TOWEL DISPOSABLE) ×3 IMPLANT
TRAP FLUID SMOKE EVACUATOR (MISCELLANEOUS) ×1 IMPLANT
TRAY FOLEY MTR SLVR 16FR STAT (SET/KITS/TRAYS/PACK) IMPLANT
TUBING ART PRESS 48 MALE/FEM (TUBING) IMPLANT
TUBING CONNECTING 10 (TUBING) ×1 IMPLANT
WATER STERILE IRR 1000ML POUR (IV SOLUTION) ×4 IMPLANT

## 2021-11-09 NOTE — Anesthesia Preprocedure Evaluation (Addendum)
Anesthesia Evaluation  Patient identified by MRN, date of birth, ID band Patient awake    Reviewed: Allergy & Precautions, NPO status , Patient's Chart, lab work & pertinent test results  History of Anesthesia Complications Negative for: history of anesthetic complications  Airway Mallampati: III  TM Distance: <3 FB Neck ROM: full    Dental  (+) Poor Dentition, Chipped, Missing   Pulmonary neg shortness of breath, sleep apnea    Pulmonary exam normal        Cardiovascular Exercise Tolerance: Good hypertension, (-) angina Normal cardiovascular exam     Neuro/Psych  Neuromuscular disease  negative psych ROS   GI/Hepatic negative GI ROS, Neg liver ROS,neg GERD  ,,  Endo/Other  negative endocrine ROS    Renal/GU Renal disease     Musculoskeletal  (+) Arthritis ,    Abdominal   Peds  Hematology negative hematology ROS (+)   Anesthesia Other Findings Past Medical History: No date: CTS (carpal tunnel syndrome) No date: DDD (degenerative disc disease), lumbar No date: Elevated PSA No date: Hyperglycemia No date: Hyperlipidemia No date: Hypertension No date: OSA (obstructive sleep apnea) No date: Renal stones No date: Sleep apnea  Past Surgical History: No date: CATARACT EXTRACTION No date: CHOLECYSTECTOMY No date: HERNIA REPAIR No date: SPINE SURGERY  BMI    Body Mass Index: 28.66 kg/m      Reproductive/Obstetrics negative OB ROS                             Anesthesia Physical Anesthesia Plan  ASA: 3 and emergent  Anesthesia Plan: General ETT and Rapid Sequence   Post-op Pain Management:    Induction: Intravenous  PONV Risk Score and Plan: Ondansetron, Dexamethasone, Midazolam and Treatment may vary due to age or medical condition  Airway Management Planned: Oral ETT  Additional Equipment:   Intra-op Plan:   Post-operative Plan: Extubation in OR  Informed Consent:  I have reviewed the patients History and Physical, chart, labs and discussed the procedure including the risks, benefits and alternatives for the proposed anesthesia with the patient or authorized representative who has indicated his/her understanding and acceptance.     Dental Advisory Given  Plan Discussed with: Anesthesiologist, CRNA and Surgeon  Anesthesia Plan Comments: (Dr. Myer Haff would like to proceed emergently with this case per his note  Patient consented for risks of anesthesia including but not limited to:  - adverse reactions to medications - damage to eyes, teeth, lips or other oral mucosa - nerve damage due to positioning  - sore throat or hoarseness - Damage to heart, brain, nerves, lungs, other parts of body or loss of life  Patient voiced understanding.)       Anesthesia Quick Evaluation

## 2021-11-09 NOTE — Interval H&P Note (Signed)
History and Physical Interval Note:  11/09/2021 5:12 PM  Robert Grimes  has presented today for surgery, with the diagnosis of subdural hematoma.  The various methods of treatment have been discussed with the patient and family. After consideration of risks, benefits and other options for treatment, the patient has consented to  Procedure(s): BURR HOLES (Left) as a surgical intervention.  The patient's history has been reviewed, patient examined, no change in status, stable for surgery.  I have reviewed the patient's chart and labs.  Questions were answered to the patient's satisfaction.     [name]

## 2021-11-09 NOTE — Anesthesia Postprocedure Evaluation (Signed)
Anesthesia Post Note  Patient: Robert Grimes  Procedure(s) Performed: Ines Bloomer HOLES (Left)  Patient location during evaluation: PACU Anesthesia Type: General Level of consciousness: awake and alert Pain management: pain level controlled Vital Signs Assessment: post-procedure vital signs reviewed and stable Respiratory status: spontaneous breathing, nonlabored ventilation, respiratory function stable and patient connected to nasal cannula oxygen Cardiovascular status: blood pressure returned to baseline and stable Postop Assessment: no apparent nausea or vomiting Anesthetic complications: no   No notable events documented.   Last Vitals:  Vitals:   11/09/21 1940 11/09/21 1945  BP: 120/73   Pulse: 81 83  Resp: 20 17  Temp:    SpO2: 96% 97%    Last Pain:  Vitals:   11/09/21 1930  TempSrc:   PainSc: 0-No pain                 Cleda Mccreedy Marianita Botkin

## 2021-11-09 NOTE — H&P (Addendum)
Referring Physician:  No referring provider defined for this encounter.  Primary Physician:  Lynnea Ferrier, MD  History of Present Illness: 11/09/2021 I saw this patient in clinic today.  He presents for admission for worsening subdural hematoma.  11/09/2021 Mr. Robert Grimes is here today with a chief complaint of a subdural hematoma and a occipital bone fracture.  He had an unwitnessed fall last month.  He had a small subdural at the time.  He reports that he is doing well, but his daughter-in-law reports that his thinking is not quite as clear as it was previously.  He is not stable on his feet.  He began using a cane.  He denies headache, dizziness, vision changes.    Past Surgery: lumbar surgery in 2001 at [facility] has no symptoms of cervical myelopathy.  The symptoms are causing a significant impact on the patient's life.   Review of Systems:  A 10 point review of systems is negative, except for the pertinent positives and negatives detailed in the HPI.  Past Medical History: Past Medical History:  Diagnosis Date   CTS (carpal tunnel syndrome)    DDD (degenerative disc disease), lumbar    Elevated PSA    Hyperglycemia    Hyperlipidemia    Hypertension    OSA (obstructive sleep apnea)    Renal stones    Sleep apnea     Past Surgical History: Past Surgical History:  Procedure Laterality Date   CATARACT EXTRACTION     CHOLECYSTECTOMY     HERNIA REPAIR     SPINE SURGERY      Allergies: Allergies as of 11/09/2021   (No Known Allergies)    Medications: No outpatient medications have been marked as taking for the 11/09/21 encounter [facility] Encounter).    Social History: Social History   Tobacco Use   Smoking status: Never   Smokeless tobacco: Never  Substance Use Topics   Alcohol use: No    Family Medical History: No family history on file.  Physical Examination: Vitals:   11/09/21 1425  BP: 131/81  Pulse: (!) 59   Temp: 97.7 F (36.5 C)  SpO2: 98%    General: Patient is well developed, well nourished, calm, collected, and in no apparent distress. Attention to examination is appropriate.  Neck:   Supple.  Full range of motion.  Respiratory: Patient is breathing without any difficulty.  Heart sounds normal no MRG. Chest Clear to Auscultation Bilaterally.   NEUROLOGICAL:     Awake, alert, oriented to person, place, and time.  Speech is clear and fluent.   Cranial Nerves: Pupils equal round and reactive to light.  Facial tone is symmetric.  Facial sensation is symmetric. Shoulder shrug is symmetric. Tongue protrusion is midline.  There is mild R pronator drift.  ROM of spine: full.    Strength: Side Biceps Triceps Deltoid Interossei Grip Wrist Ext. Wrist Flex.  R 5 5 5 5 5 5 5   L 5 5 5 5 5 5 5    Side Iliopsoas Quads Hamstring PF DF EHL  R 5 5 5 5 5 5   L 5 5 5 5 5 5    Reflexes are 3+ and symmetric at the biceps, triceps, brachioradialis, patella and achilles.   Hoffman's is present on the right.   Bilateral upper and lower extremity sensation is intact to light touch.    No evidence of dysmetria noted.  Gait is abnormal and requires a cane.  Medical Decision Making  Imaging: CT Head 11/07/21 IMPRESSION: 1. In comparison to CT head from October 24, 2021, interval increase in size of subdural hematomas bilaterally (measuring up to 1.5 cm in thickness on the left and 1 cm on the right). Areas of internal hyperdensity bilaterally are compatible with acute/recent hemorrhage. 2. Similar versus slightly increased rightward midline shift, measuring 5 mm at the foramen of Monroe.   These results will be called to the ordering clinician or representative by the Radiologist Assistant, and communication documented in the PACS or [employer].   Electronically Signed: By: Feliberto Harts M.D. On: 11/07/2021 15:49  Ct Head 11/09/2021 IMPRESSION: 1. Interval increase in size  of the left cerebral convexity subdural hematoma, best seen along the left parietal convexity, where there is increased hyperdense component measuring up to 1.6 cm, previously 5 mm. The more mixed density component along the left frontal convexity has also increased in size, now measuring up to 2.0 cm, previously 1.5 cm. 2. Unchanged rightward midline shift measuring up to 7 mm.     Electronically Signed   By: Lorenza Cambridge M.D.   On: 11/09/2021 15:49  I have personally reviewed the images and agree with the above interpretation.  Assessment and Plan: Mr. Gonzaga is a pleasant 71 y.o. male with worsening subdural hematomas.  Given his continued symptoms, I recommended surgical intervention.  I discussed the planned procedure at length with the patient and his daughter in law, including the risks, benefits, alternatives, and indications. The risks discussed include but are not limited to bleeding, infection, need for reoperation, spinal fluid leak, stroke, vision loss, anesthetic complication, coma, paralysis, and even death. I also described in detail that improvement was not guaranteed.  The patient, son, and daughter in law asked that we proceed with surgery. I described the surgery in layman's terms, and gave ample opportunity for questions, which were answered to the best of my ability.   He last ate around 10 am.  Due to his worsening condition and worsening CT findings, I have recommended proceeding with surgery.  Given the urgent situation, I feel it is indicated to proceed within the 8 hour NPO window.    Kaliope Quinonez K. Myer Haff MD, [facility] Neurosurgery

## 2021-11-09 NOTE — Transfer of Care (Signed)
Immediate Anesthesia Transfer of Care Note  Patient: Robert Grimes  Procedure(s) Performed: Ines Bloomer HOLES (Left)  Patient Location: PACU  Anesthesia Type:General  Level of Consciousness: awake and alert   Airway & Oxygen Therapy: Patient Spontanous Breathing and Patient connected to nasal cannula oxygen  Post-op Assessment: Report given to RN and Post -op Vital signs reviewed and stable  Post vital signs: Reviewed and stable  Last Vitals:  Vitals Value Taken Time  BP 124/66 11/09/21 2015  Temp 36.2 C 11/09/21 1910  Pulse 78 11/09/21 2026  Resp 22 11/09/21 2026  SpO2 98 % 11/09/21 2026  Vitals shown include unvalidated device data.  Last Pain:  Vitals:   11/09/21 1930  TempSrc:   PainSc: 0-No pain         Complications: No notable events documented.

## 2021-11-09 NOTE — Telephone Encounter (Signed)
     Patient  visit on 10/24/2021  at [facility] was for Unspecified fracture of occiput, initial encounter for closed fracture.  Have you been able to follow up with your primary care physician? Yes  The patient was or was not able to obtain any needed medicine or equipment. No medications prescribed.  Are there diet recommendations that you are having difficulty following? No  Patient expresses understanding of discharge instructions and education provided has no other needs at this time.    Robert Grimes [facility]  [facility] Population Health Community Resource Care Guide   [name]  ?? [phone]   [email or web address]: [email or web address]  [email or web address]

## 2021-11-09 NOTE — ED Provider Notes (Signed)
[facility] Provider Note   Event Date/Time   First MD Initiated Contact with Patient 11/09/21 1501     (approximate) History  Loss of Consciousness (Syncopal episode)  HPI Robert Grimes is a 71 y.o. male with a stated past medical history of recently diagnosed subdural hematomas bilaterally who presents from neurosurgery clinic today after syncopal event that occurred after being told his options for artery embolization versus bur hole.  Patient states that he did feel very overwhelmed at this time as well as feeling flushed prior to passing out.  Patient states that he came back to baseline quickly once he regained consciousness.  Patient denies any subsequent loss of consciousness or lightheadedness but does endorse mild headache. ROS: Patient currently denies any vision changes, tinnitus, difficulty speaking, facial droop, sore throat, chest pain, shortness of breath, abdominal pain, nausea/vomiting/diarrhea, dysuria, or weakness/numbness/paresthesias in any extremity   Physical Exam  Triage Vital Signs: ED Triage Vitals  Enc Vitals Group     BP 11/09/21 1425 131/81     Pulse Rate 11/09/21 1425 (!) 59     Resp --      Temp 11/09/21 1425 97.7 F (36.5 C)     Temp Source 11/09/21 1425 Oral     SpO2 11/09/21 1425 98 %     Weight --      Height 11/09/21 1426 5\' 3"  (1.6 m)     Head Circumference --      Peak Flow --      Pain Score 11/09/21 1426 0     Pain Loc --      Pain Edu? --      Excl. in GC? --    Most recent vital signs: Vitals:   11/09/21 2245 11/09/21 2300  BP:  119/79  Pulse: 80 82  Resp: 17 (!) 21  Temp:    SpO2: 98% 96%   General: Awake, oriented x4. CV:  Good peripheral perfusion.  Resp:  Normal effort.  Abd:  No distention.  Other:  Elderly the overweight Caucasian male laying in bed in no acute distress.  NIHSS 0 ED Results / Procedures / Treatments  Labs (all labs ordered are listed, but only abnormal results are  displayed) Labs Reviewed  BASIC METABOLIC PANEL - Abnormal; Notable for the following components:      Result Value   Potassium 3.2 (*)    Glucose, Bld 113 (*)    All other components within normal limits  GLUCOSE, CAPILLARY - Abnormal; Notable for the following components:   Glucose-Capillary 119 (*)    All other components within normal limits  CBC WITH DIFFERENTIAL/PLATELET  BASIC METABOLIC PANEL  CBC   EKG ED ECG REPORT I, 13/09/23, the attending physician, personally viewed and interpreted this ECG. Date: 11/09/2021 EKG Time: 1432 Rate: 63 Rhythm: normal sinus rhythm QRS Axis: normal Intervals: normal ST/T Wave abnormalities: normal Narrative Interpretation: no evidence of acute ischemia RADIOLOGY ED MD interpretation: CT of the head without contrast interpreted by me and shows interval increase in size of a left cerebral convexity subdural hematoma as well as increased hyperdense component measuring up to 1.6 cm it was previously 5 mm.  There is unchanged rightward midline shift measuring up to 7 mm -Agree with radiology assessment Official radiology report(s): CT Head Wo Contrast  Result Date: 11/09/2021 CLINICAL DATA:  History of recent subdural hematoma.  Syncope today. EXAM: CT HEAD WITHOUT CONTRAST TECHNIQUE: Contiguous axial images were obtained from the base of  the skull through the vertex without intravenous contrast. RADIATION DOSE REDUCTION: This exam was performed according to the departmental dose-optimization program which includes automated exposure control, adjustment of the mA and/or kV according to patient size and/or use of iterative reconstruction technique. COMPARISON:  CT Brain 11/07/21 FINDINGS: Brain: Compared to prior exam there is interval increase in size of the left cerebral convexity subdural hematoma, this is best seen along the left parietal convexity, where there is increased hyperdense component measuring up to 1.6 cm, previously 5 mm (series  2, image 20). The more mixed density component along the left frontal convexity has also increased in size, now measuring up to 2.0 cm, previously 1.5 cm, when measured in a similar orientation. Subdural hematoma along the right frontoparietal convexity appears similar in size measuring up to 6 mm. There is persistent rightward midline shift measuring up to 7 mm, unchanged from prior exam. The appearance of the basal cisterns is also unchanged from prior exam. There is no evidence of parenchymal hemorrhage. No CT evidence of an infarct. Vascular: No hyperdense vessel or unexpected calcification. Skull: There is also a nondisplaced fracture through the occipital bone (series 3, image 15), unchanged from prior exam Sinuses/Orbits: No acute finding. Other: None IMPRESSION: 1. Interval increase in size of the left cerebral convexity subdural hematoma, best seen along the left parietal convexity, where there is increased hyperdense component measuring up to 1.6 cm, previously 5 mm. The more mixed density component along the left frontal convexity has also increased in size, now measuring up to 2.0 cm, previously 1.5 cm. 2. Unchanged rightward midline shift measuring up to 7 mm. Electronically Signed   By: Lorenza Cambridge M.D.   On: 11/09/2021 15:49   PROCEDURES: Critical Care performed: Yes, see critical care procedure note(s) .1-3 Lead EKG Interpretation  Performed by: Merwyn Katos, MD Authorized by: Merwyn Katos, MD     Interpretation: normal     ECG rate:  61   ECG rate assessment: normal     Rhythm: sinus rhythm     Ectopy: none     Conduction: normal   CRITICAL CARE Performed by: Merwyn Katos  Total critical care time: 31 minutes  Critical care time was exclusive of separately billable procedures and treating other patients.  Critical care was necessary to treat or prevent imminent or life-threatening deterioration.  Critical care was time spent personally by me on the following  activities: development of treatment plan with patient and/or surrogate as well as nursing, discussions with consultants, evaluation of patient's response to treatment, examination of patient, obtaining history from patient or surrogate, ordering and performing treatments and interventions, ordering and review of laboratory studies, ordering and review of radiographic studies, pulse oximetry and re-evaluation of patient's condition.  MEDICATIONS ORDERED IN ED: Medications  ondansetron (ZOFRAN) tablet 4 mg (has no administration in time range)    Or  ondansetron (ZOFRAN) injection 4 mg (has no administration in time range)  promethazine (PHENERGAN) tablet 12.5-25 mg (has no administration in time range)  labetalol (NORMODYNE) injection 10-40 mg (has no administration in time range)  pantoprazole (PROTONIX) injection 40 mg (40 mg Intravenous Given 11/09/21 2239)  0.9 % NaCl with KCl 20 mEq/ L  infusion ( Intravenous Infusion Verify 11/09/21 2200)  acetaminophen (TYLENOL) tablet 650 mg (has no administration in time range)    Or  acetaminophen (TYLENOL) suppository 650 mg (has no administration in time range)  HYDROcodone-acetaminophen (NORCO/VICODIN) 5-325 MG per tablet 1 tablet (has no  administration in time range)  docusate sodium (COLACE) capsule 100 mg (100 mg Oral Given 11/09/21 2239)  bisacodyl (DULCOLAX) EC tablet 5 mg (has no administration in time range)  sodium phosphate (FLEET) 7-19 GM/118ML enema 1 enema (has no administration in time range)  lisinopril (ZESTRIL) tablet 20 mg (has no administration in time range)  hydrochlorothiazide (HYDRODIURIL) tablet 12.5 mg (has no administration in time range)  chlorhexidine (PERIDEX) 0.12 % solution 15 mL (15 mLs Mouth/Throat Given 11/09/21 1730)    Or  Oral care mouth rinse ( Mouth Rinse See Alternative 11/09/21 1730)  ceFAZolin (ANCEF) IVPB 2g/100 mL premix (2 g Intravenous Given 11/09/21 1746)  ceFAZolin (ANCEF) 2-4 GM/100ML-% IVPB (  Override  pull for Anesthesia 11/09/21 1751)   IMPRESSION / MDM / ASSESSMENT AND PLAN / ED COURSE  I reviewed the triage vital signs and the nursing notes.                             The patient is on the cardiac monitor to evaluate for evidence of arrhythmia and/or significant heart rate changes. Patient's presentation is most consistent with acute presentation with potential threat to life or bodily function. Patient presents from neurosurgical clinic for a syncopal episode after being explained the possible surgical options for his recently diagnosed subdural hematomas bilaterally Negative neurological deficits from baseline Negative elevated blood pressures  Unlikely infectious etiology or changes secondary to ingestion  Workup: POCT glucose. CBC, BMP, PT/INR, PTT, troponin, type and screen ECG and non-contrast head CT  Findings: CT Brain: intracranial hemorrhage in the subdural region of the left cerebral convexity as well as a left parietal convexity ECG: No cerebral T waves. No STEMI  Interventions: BP Control: PRN Nicardipine 5mg /hr by slow infusion (49ml/hr) titrating to maximum of 30mg /hr with permissive hypertension (SBP goal 180)  Consults: Neurosurgery-plans to take patient to the OR tonight  Dispo: Admit to the ICU   FINAL CLINICAL IMPRESSION(S) / ED DIAGNOSES   Final diagnoses:  SDH (subdural hematoma) (HCC)   Rx / DC Orders   ED Discharge Orders     None      Note:  This document was prepared using Dragon voice recognition software and may include unintentional dictation errors.   45m, MD 11/10/21 [phone]

## 2021-11-09 NOTE — ED Triage Notes (Signed)
Pt at a follow up appointment with neuro to discuss options regarding subdural hematoma. During visit pt had syncopal episode and guided to a chair by staff. Pt lost consciousness for unknown amount of time.

## 2021-11-09 NOTE — Op Note (Signed)
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   Findings: subdural hematoma  Preoperative Diagnosis: Subdural hematoma Postoperative Diagnosis: same   EBL: 10 ml IVF: see AR ml Drains: subdural drain placed Disposition: Extubated and Stable to PACU Complications: none  A foley catheter was placed.   Preoperative Note:   Risks of surgery discussed include: infection, bleeding, stroke, coma, death, paralysis, CSF leak, weakness, need for further surgery, persistent symptoms, and the risks of anesthesia. The patient understood these risks and agreed to proceed.  Operative Note:   Procedure:  1) Left frontal burr hole for drainage of subdural hematoma   Procedure: After obtaining informed consent, the patient taken to the operating room, placed in supine position, general anesthesia induced.  His head was placed on a horseshoe.  A linear incision was marked 5 cm lateral to the midline on the left on the coronal suture.  The hair was clipped.  The operative site was prepped and draped.  A timeout was performed.    The incision was injected with local.  A linear incision was made and carried to the skull.  The pericranium was reflected laterally and a small self-retaining retractor placed.  The drill was used to make a burr hole.  The dura was coagulated, then divided with a #15 blade.  A membrane was encountered and opened.    Dark brown subdural fluid was encountered.  A bactiseal ventriculostomy catheter was placed into the subdural space, then tunneled medially and posteriorly approximately 5 cm from the incision.  The connector was placed and secured.  Brisk drainage was noted.  The primary incision was irrigated, then closed with vicryl and monocryl.  The drain was secured to the skin, then a 3-0 monocryl was placed for future closure of the drainage hole, and tacked  to the drain with a steristrip.  The drainage system was flushed, then attached to the drain.  Good drainage was noted.    Sterile dressings were placed.  Sponge and pattie counts were correct at the end of the procedure.   Venetia Night MD

## 2021-11-10 ENCOUNTER — Inpatient Hospital Stay: Payer: PPO

## 2021-11-10 ENCOUNTER — Encounter: Payer: Self-pay | Admitting: Neurosurgery

## 2021-11-10 LAB — CBC
HCT: 39.4 % (ref 39.0–52.0)
Hemoglobin: 13.5 g/dL (ref 13.0–17.0)
MCH: 29.9 pg (ref 26.0–34.0)
MCHC: 34.3 g/dL (ref 30.0–36.0)
MCV: 87.2 fL (ref 80.0–100.0)
Platelets: 201 10*3/uL (ref 150–400)
RBC: 4.52 MIL/uL (ref 4.22–5.81)
RDW: 13.3 % (ref 11.5–15.5)
WBC: 9.3 10*3/uL (ref 4.0–10.5)
nRBC: 0 % (ref 0.0–0.2)

## 2021-11-10 LAB — BASIC METABOLIC PANEL
Anion gap: 5 (ref 5–15)
BUN: 19 mg/dL (ref 8–23)
CO2: 26 mmol/L (ref 22–32)
Calcium: 8.6 mg/dL — ABNORMAL LOW (ref 8.9–10.3)
Chloride: 106 mmol/L (ref 98–111)
Creatinine, Ser: 0.73 mg/dL (ref 0.61–1.24)
GFR, Estimated: >60 mL/min (ref >60)
Glucose, Bld: 118 mg/dL — ABNORMAL HIGH (ref 70–99)
Potassium: 3.7 mmol/L (ref 3.5–5.1)
Sodium: 137 mmol/L (ref 135–145)

## 2021-11-10 LAB — MRSA NEXT GEN BY PCR, NASAL: MRSA by PCR Next Gen: NOT DETECTED

## 2021-11-10 MED ORDER — ENOXAPARIN SODIUM 40 MG/0.4ML IJ SOSY
40.0000 mg | PREFILLED_SYRINGE | Freq: Every day | INTRAMUSCULAR | Status: DC
  Administered 2021-11-10: 40 mg via SUBCUTANEOUS
  Filled 2021-11-10: qty 0.4

## 2021-11-10 MED ORDER — CHLORHEXIDINE GLUCONATE CLOTH 2 % EX PADS
6.0000 | MEDICATED_PAD | Freq: Every day | CUTANEOUS | Status: DC
  Administered 2021-11-10 – 2021-11-11 (×2): 6 via TOPICAL

## 2021-11-10 NOTE — Progress Notes (Signed)
Patient admitted to the unit and placed on telemetry. A skin assessment was completed by myself and Shelbie Hutching. Patient has a bandage with staples to cover burr hole in head and a staple in the head where a head drain was removed. No other skin issues noted at this time.

## 2021-11-10 NOTE — Progress Notes (Incomplete)
Patient admitted to the unit and placed on telemetry. A skin assessment was completed by myself and Shelbie Hutching. Patient has a bandage with staples to cover burr hole in head and a staple in the head where a head drain was removed. No other sl

## 2021-11-10 NOTE — Evaluation (Signed)
Physical Therapy Evaluation Patient Details Name: Robert Grimes MRN: [id number] DOB: 1950-06-15 Today's Date: 11/10/2021  History of Present Illness  Pt is a 71 y/o M admitted on 11/09/21 after presenting from neurosurgery clinic following syncopal event. Pt was at clinic for f/u re: small SDH & occipital bone fx. Pt is s/p L burr holes by neurosurgery.  Clinical Impression  Pt seen for PT evaluation with co-tx with OT. Pt received in recliner, AxOx4, pleasant & willing to participate. Pt reports he was independent without AD prior to fall ~month ago but has been using RW since then & attending OPPT for LLE (then later states RLE). Pt lives alone with 4 steps with L rail to enter & full flight to bedroom/bathroom. On this date, pt is able to complete STS with cuing for safe hand placement & ambulates 1 loop around both sides of ICU with RW & CGA. Pt demonstrates decreased RLE dorsiflexion during swing phase but when discussing this with pt he reports this is not new. Anticipate pt can d/c home with OPPT f/u & son assisting. Will continue to follow pt acutely to address balance, gait with LRAD, and stair negotiation.  BP checked in RUE: Sitting 119/81 mmHg MAP 92 Standing 120/75 mmHg MAP 85      Recommendations for follow up therapy are one component of a multi-disciplinary discharge planning process, led by the attending physician.  Recommendations may be updated based on patient status, additional functional criteria and insurance authorization.  Follow Up Recommendations Outpatient PT      Assistance Recommended at Discharge Intermittent Supervision/Assistance  Patient can return home with the following  A little help with walking and/or transfers;A little help with bathing/dressing/bathroom;Assistance with cooking/housework;Assist for transportation;Help with stairs or ramp for entrance    Equipment Recommendations None recommended by PT  Recommendations for Other Services        Functional Status Assessment Patient has had a recent decline in their functional status and demonstrates the ability to make significant improvements in function in a reasonable and predictable amount of time.     Precautions / Restrictions Precautions Precautions: Fall Restrictions Weight Bearing Restrictions: No      Mobility  Bed Mobility               General bed mobility comments: not tested, pt received & left sitting in recliner    Transfers Overall transfer level: Needs assistance Equipment used: Rolling walker (2 wheels) Transfers: Sit to/from Stand Sit to Stand: Supervision           General transfer comment: cuing re: safe hand placement during STS with RW    Ambulation/Gait Ambulation/Gait assistance: Min guard Gait Distance (Feet): 200 Feet Assistive device: Rolling walker (2 wheels) Gait Pattern/deviations: Decreased dorsiflexion - right, Decreased step length - left, Decreased stride length, Decreased step length - right Gait velocity: decreased     General Gait Details: decreased dorsiflexion RLE during swing phase, decreased RLE heel strike  Stairs            Wheelchair Mobility    Modified Rankin (Stroke Patients Only)       Balance Overall balance assessment: Needs assistance Sitting-balance support: Feet supported Sitting balance-Leahy Scale: Good     Standing balance support: Bilateral upper extremity supported, During functional activity Standing balance-Leahy Scale: Fair                               Pertinent Vitals/Pain  Pain Assessment Pain Assessment: Faces Faces Pain Scale: Hurts a little bit Pain Location: incision site/HA Pain Descriptors / Indicators: Discomfort, Headache Pain Intervention(s): Monitored during session    Home Living Family/patient expects to be discharged to:: Private residence Living Arrangements: Alone Available Help at Discharge: Family;Available PRN/intermittently Type  of Home: House Home Access: Stairs to enter Entrance Stairs-Rails: Left Entrance Stairs-Number of Steps: 4   Home Layout: Multi-level;Bed/bath upstairs Home Equipment: Agricultural consultant (2 wheels);Gilmer Mor - single point Additional Comments: Lives alone, son next door & can spend the night if needed.    Prior Function               Mobility Comments: Pt reports he was independent without AD until fall ~1 month ago. Pt reports he has been using RW since then. Doesn't drive much. Pt reports he was attending OPPT for LLE issues but then states it was because his R foot was dragging. (per brief review of care everywhere pt was attending OPPT for LLE issues, so unsure how new RLE issues are).       Hand Dominance        Extremity/Trunk Assessment   Upper Extremity Assessment Upper Extremity Assessment: Defer to OT evaluation;Overall WFL for tasks assessed    Lower Extremity Assessment Lower Extremity Assessment: RLE deficits/detail (BLE proprioception & sensation to light touch intact) RLE Deficits / Details: 3-/5 R dorsiflexion       Communication   Communication: No difficulties  Cognition Arousal/Alertness: Awake/alert Behavior During Therapy: WFL for tasks assessed/performed Overall Cognitive Status: Within Functional Limits for tasks assessed                                 General Comments: Pt initially reports he was attending OPPT prior to admission 2/2 LLE issues then later states it was for RLE foot dragging, unsure if RLE deficits are new/related to original fall or not.        General Comments      Exercises     Assessment/Plan    PT Assessment Patient needs continued PT services  PT Problem List Decreased strength;Decreased coordination;Decreased activity tolerance;Decreased balance;Decreased mobility;Decreased knowledge of use of DME;Decreased range of motion       PT Treatment Interventions DME instruction;Therapeutic exercise;Gait  training;Balance training;Stair training;Neuromuscular re-education;Functional mobility training;Therapeutic activities;Patient/family education    PT Goals (Current goals can be found in the Care Plan section)  Acute Rehab PT Goals Patient Stated Goal: get better PT Goal Formulation: With patient Time For Goal Achievement: 11/24/21 Potential to Achieve Goals: Good    Frequency 7X/week     Co-evaluation PT/OT/SLP Co-Evaluation/Treatment: Yes Reason for Co-Treatment: Complexity of the patient's impairments (multi-system involvement) PT goals addressed during session: Mobility/safety with mobility;Proper use of DME;Balance         AM-PAC PT "6 Clicks" Mobility  Outcome Measure Help needed turning from your back to your side while in a flat bed without using bedrails?: None Help needed moving from lying on your back to sitting on the side of a flat bed without using bedrails?: None Help needed moving to and from a bed to a chair (including a wheelchair)?: A Little Help needed standing up from a chair using your arms (e.g., wheelchair or bedside chair)?: A Little Help needed to walk in hospital room?: A Little Help needed climbing 3-5 steps with a railing? : A Little 6 Click Score: 20    End of  Session   Activity Tolerance: Patient tolerated treatment well Patient left: in chair (in care of OT) Nurse Communication: Mobility status PT Visit Diagnosis: Unsteadiness on feet (R26.81);Other abnormalities of gait and mobility (R26.89);Muscle weakness (generalized) (M62.81)    Time: 4098-1191 PT Time Calculation (min) (ACUTE ONLY): 17 min   Charges:   PT Evaluation $PT Eval Moderate Complexity: 1 Mod          Aleda Grana, PT, DPT 11/10/21, 2:32 PM   Sandi Mariscal 11/10/2021, 2:29 PM

## 2021-11-10 NOTE — Progress Notes (Addendum)
Great day. Head drain removed this morning. Up to chair shortly after for most of the day.  Walked with PT around the unit. Back to chair. 1600 Back to bed with minimal help.1700 Attempted to void x 3. 1800 Still complaining of bladder pain. Bladder scan done . Greater than 750 scanned. 1830 In and out catherized  for 800 ml of clear yellow urine and a few blood clots. Patient states he feels better now.

## 2021-11-10 NOTE — Addendum Note (Signed)
Addended by: Venetia Night on: 11/10/2021 02:37 PM   Modules accepted: Orders

## 2021-11-10 NOTE — Progress Notes (Signed)
    Attending Progress Note  History: Brandyn Lowrey is here for a subdural hematoma.  POD1: Doing well  Physical Exam: Vitals:   11/10/21 0500 11/10/21 0600  BP: 121/73 117/73  Pulse: 68 76  Resp: 13 19  Temp:    SpO2: 96% 95%    AA Ox3 CNI  Strength:5/5 throughout BUE No drift  Data:  Other tests/results:  CT Head 11/10/21 Majority removal of subdural hematoma with resolution of midline shift  Assessment/Plan:  Vonn Sliger is doing well after evacuation of chronic subdural hematoma  - mobilize today - increase HOB - pain control - DVT prophylaxis to start today - PTOT - Remove foley   Venetia Night MD, [facility] Department of Neurosurgery

## 2021-11-10 NOTE — Evaluation (Signed)
Occupational Therapy Evaluation Patient Details Name: Robert Grimes MRN: [id number] DOB: 20-Apr-1950 Today's Date: 11/10/2021   History of Present Illness Pt is a 71 y/o M admitted on 11/09/21 after presenting from neurosurgery clinic following syncopal event. Pt was at clinic for f/u re: small SDH & occipital bone fx. Pt is s/p L burr holes by neurosurgery.   Clinical Impression   Chart reviewed, Co tx completed with PT on this date. Pt is in agreement to OT evaluation. PTA pt was MOD I in ADL/IADL, was requiring use of RW after fall approx 1 month ago. Cognition appears WFL at this time. RUE with mild strength deficits, FMC/dexterity appears [facility] however will continue to assess. LUE appears WFL. Pt presents with deficits in endurance, activity tolerance, balance affecting safe and optimal ADL completion. MIN A required for LB dressing, SET UP for grooming tasks. Recommend outpatient OT to address deficits pending pt progress. OT will continue to follow acutely.      Recommendations for follow up therapy are one component of a multi-disciplinary discharge planning process, led by the attending physician.  Recommendations may be updated based on patient status, additional functional criteria and insurance authorization.   Follow Up Recommendations  Outpatient OT    Assistance Recommended at Discharge Frequent or constant Supervision/Assistance  Patient can return home with the following A little help with bathing/dressing/bathroom    Functional Status Assessment  Patient has had a recent decline in their functional status and demonstrates the ability to make significant improvements in function in a reasonable and predictable amount of time.  Equipment Recommendations  BSC/3in1;Tub/shower seat    Recommendations for Other Services       Precautions / Restrictions Precautions Precautions: Fall Restrictions Weight Bearing Restrictions: No      Mobility Bed Mobility                General bed mobility comments: NT in recliner pre/post session    Transfers Overall transfer level: Needs assistance Equipment used: Rolling walker (2 wheels) Transfers: Sit to/from Stand Sit to Stand: Supervision                  Balance Overall balance assessment: Needs assistance Sitting-balance support: Feet supported Sitting balance-Leahy Scale: Good     Standing balance support: Bilateral upper extremity supported, During functional activity Standing balance-Leahy Scale: Fair                             ADL either performed or assessed with clinical judgement   ADL Overall ADL's : Needs assistance/impaired     Grooming: Wash/dry face;Set up;Sitting               Lower Body Dressing: Minimal assistance;Sitting/lateral leans Lower Body Dressing Details (indicate cue type and reason): socks Toilet Transfer: Supervision/safety;+2 for safety/equipment;Rolling walker (2 wheels);Ambulation Toilet Transfer Details (indicate cue type and reason): simulated         Functional mobility during ADLs: Supervision/safety;Min guard;Rolling walker (2 wheels) (approx 200' in hallway with RW)       Vision Patient Visual Report: No change from baseline Vision Assessment?: Yes Eye Alignment: Within Functional Limits     Perception     Praxis      Pertinent Vitals/Pain Pain Assessment Pain Assessment: Faces Faces Pain Scale: Hurts a little bit Pain Location: incision site Pain Descriptors / Indicators: Discomfort, Headache Pain Intervention(s): Limited activity within patient's tolerance, Monitored during session  Hand Dominance Right   Extremity/Trunk Assessment Upper Extremity Assessment Upper Extremity Assessment: Overall WFL for tasks assessed;RUE deficits/detail;LUE deficits/detail RUE Sensation: WNL RUE Coordination: WNL LUE Sensation: WNL LUE Coordination: WNL   Lower Extremity Assessment Lower Extremity Assessment: Defer  to PT evaluation RLE Deficits / Details: 3-/5 R dorsiflexion   Cervical / Trunk Assessment Cervical / Trunk Assessment: Normal   Communication Communication Communication: No difficulties   Cognition Arousal/Alertness: Awake/alert Behavior During Therapy: WFL for tasks assessed/performed Overall Cognitive Status: Within Functional Limits for tasks assessed                                       General Comments       Exercises Other Exercises Other Exercises: edu re: role of OT, role of rehab, discharge recommendations, home safety, home set up   Shoulder Instructions      Home Living Family/patient expects to be discharged to:: Private residence Living Arrangements: Alone Available Help at Discharge: Family;Available PRN/intermittently Type of Home: House Home Access: Stairs to enter [employer] of Steps: 4 Entrance Stairs-Rails: Left Home Layout: Multi-level;Bed/bath upstairs   Alternate Level Stairs-Rails: Left           Home Equipment: Rolling Walker (2 wheels);Gilmer Mor - single point   Additional Comments: Lives alone, son next door & can spend the night if needed.      Prior Functioning/Environment               Mobility Comments: Pt reports he was independent without AD until fall ~1 month ago. Pt reports he has been using RW since then. Doesn't drive much. Pt reports he was attending OPPT for LLE issues but then states it was because his R foot was dragging. (per brief review of care everywhere pt was attending OPPT for LLE issues, so unsure how new RLE issues are). ADLs Comments: pt reports prior to fall approx 1 month ago, MOD I in ADL/IADL, driving        OT Problem List: Decreased strength;Decreased activity tolerance;Decreased safety awareness;Decreased knowledge of use of DME or AE      OT Treatment/Interventions: Self-care/ADL training;Patient/family education;Therapeutic exercise;Balance training;Therapeutic  activities;DME and/or AE instruction    OT Goals(Current goals can be found in the care plan section) Acute Rehab OT Goals Patient Stated Goal: go home OT Goal Formulation: With patient Time For Goal Achievement: 11/24/21 Potential to Achieve Goals: Good ADL Goals Pt Will Perform Grooming: with modified independence;sitting;standing Pt Will Perform Lower Body Dressing: with modified independence Pt Will Transfer to Toilet: with modified independence;ambulating Pt Will Perform Toileting - Clothing Manipulation and hygiene: with modified independence  OT Frequency: Min 2X/week    Co-evaluation PT/OT/SLP Co-Evaluation/Treatment: Yes Reason for Co-Treatment: To address functional/ADL transfers;For patient/therapist safety PT goals addressed during session: Mobility/safety with mobility;Proper use of DME;Balance OT goals addressed during session: ADL's and self-care      AM-PAC OT "6 Clicks" Daily Activity     Outcome Measure Help from another person eating meals?: None Help from another person taking care of personal grooming?: None Help from another person toileting, which includes using toliet, bedpan, or urinal?: A Little Help from another person bathing (including washing, rinsing, drying)?: A Little Help from another person to put on and taking off regular upper body clothing?: None Help from another person to put on and taking off regular lower body clothing?: A Little 6 Click Score: 21  End of Session Equipment Utilized During Treatment: Gait belt;Rolling walker (2 wheels) Nurse Communication: Mobility status  Activity Tolerance: Patient tolerated treatment well Patient left: in chair;with call bell/phone within reach  OT Visit Diagnosis: Unsteadiness on feet (R26.81);History of falling (Z91.81)                Time: 5784-6962 OT Time Calculation (min): 21 min Charges:  OT General Charges $OT Visit: 1 Visit OT Evaluation $OT Eval Moderate Complexity: 1 Mod  Oleta Mouse, OTD OTR/L  11/10/21, 3:59 PM

## 2021-11-11 NOTE — Progress Notes (Cosign Needed)
Occupational Therapy * Physical Therapy * Speech Therapy  DATE 11/11/2021 PATIENT NAME Robert Grimes PATIENT MRN  [id number]  DIAGNOSIS/DIAGNOSIS CODE DATE OF DISCHARGE  PRIMARY CARE PHYSICIAN Dr. Graciela Husbands PCP PHONE/FAX [phone]  Dear Provider (Name: [facility]  Fax: [phone]):    I certify that I have examined this patient and that occupational/physical/speech therapy is necessary on an outpatient basis.    The patient has expressed interest in completing their recommended course of therapy at your location.  Once a formal order from the patient's primary care physician has been obtained, please contact him/her to schedule an appointment for evaluation at your earliest convenience.   [ X ]  Physical Therapy Evaluate and Treat          [ X ]  Occupational Therapy Evaluate and Treat                                  [  ]  Speech Therapy Evaluate and Treat  The patient's primary care physician (listed above) must furnish and be responsible for a formal order such that the recommended services may be furnished while under the primary physician's care, and that the plan of care will be established and reviewed every 30 days (or more often if condition necessitates).

## 2021-11-11 NOTE — Progress Notes (Signed)
Mobility Specialist - Progress Note    11/11/21 1340  Mobility  Activity Stood at bedside;Ambulated with assistance in hallway  Level of Assistance Independent after set-up  Assistive Device Front wheel walker  Distance Ambulated (ft) 480 ft  Activity Response Tolerated well  Mobility Referral Yes  $Mobility charge 1 Mobility   Pt resting in bed on RA upon entry. Pt STS and ambulates in hallway indep. Pt returned to recliner and was left with needs in reach. (Family)present in the room and RN entered room as I left.   Johnathan Hausen Mobility Specialist 11/11/21, 1:49 PM

## 2021-11-11 NOTE — Progress Notes (Signed)
    Attending Progress Note  History: Robert Grimes is here for a subdural hematoma.  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

## 2021-11-11 NOTE — Progress Notes (Signed)
Physical Therapy Treatment Patient Details Name: Robert Grimes MRN: [id number] DOB: 08/09/1950 Today's Date: 11/11/2021   History of Present Illness Pt is a 71 y/o M admitted on 11/09/21 after presenting from neurosurgery clinic following syncopal event. Pt was at clinic for f/u re: small SDH & occipital bone fx. Pt is s/p L burr holes by neurosurgery.    PT Comments    Safely completes stairs necessary for discharge home; does require single UE support and min +1 for optimal safety.  Per patient, son will be available to support/assist upon discharge home.  BERG balance assessment suggestive of moderate level static/dynamic balance deficits (score 34/56); do recommend continued use of RW for all mobility at this time to mitigate fall risk.  Patient voiced understanding and agreement; has RW at home.     Recommendations for follow up therapy are one component of a multi-disciplinary discharge planning process, led by the attending physician.  Recommendations may be updated based on patient status, additional functional criteria and insurance authorization.  Follow Up Recommendations  Outpatient PT     Assistance Recommended at Discharge Intermittent Supervision/Assistance  Patient can return home with the following A little help with walking and/or transfers;A little help with bathing/dressing/bathroom;Assistance with cooking/housework;Assist for transportation;Help with stairs or ramp for entrance   Equipment Recommendations       Recommendations for Other Services       Precautions / Restrictions Precautions Precautions: Fall Restrictions Weight Bearing Restrictions: No     Mobility  Bed Mobility Overal bed mobility: Modified Independent                  Transfers Overall transfer level: Needs assistance Equipment used: Rolling walker (2 wheels) Transfers: Sit to/from Stand Sit to Stand: Supervision                Ambulation/Gait Ambulation/Gait  assistance: Supervision Gait Distance (Feet): 220 Feet Assistive device: Rolling walker (2 wheels)         General Gait Details: decreased dorsiflexion RLE during swing phase, decreased RLE heel strike; mild circumduction in swing phase   Stairs Stairs: Yes Stairs assistance: Min assist Stair Management: One rail Left Number of Stairs: 6 General stair comments: step to gait pattern, patient self-selecting to ascend and descend with R LE; broad BOS; does require single UE support and +1 for optimal safety   Wheelchair Mobility    Modified Rankin (Stroke Patients Only)       Balance Overall balance assessment: Needs assistance Sitting-balance support: No upper extremity supported, Feet supported Sitting balance-Leahy Scale: Good     Standing balance support: Bilateral upper extremity supported Standing balance-Leahy Scale: Fair                   Standardized Balance Assessment Standardized Balance Assessment : Berg Balance Test Berg Balance Test Sit to Stand: Able to stand without using hands and stabilize independently Standing Unsupported: Able to stand safely 2 minutes Sitting with Back Unsupported but Feet Supported on Floor or Stool: Able to sit safely and securely 2 minutes Stand to Sit: Sits safely with minimal use of hands Transfers: Able to transfer safely, minor use of hands Standing Unsupported with Eyes Closed: Able to stand 10 seconds with supervision Standing Ubsupported with Feet Together: Needs help to attain position but able to stand for 30 seconds with feet together From Standing, Reach Forward with Outstretched Arm: Reaches forward but needs supervision From Standing Position, Pick up Object from Floor: Able to pick up  shoe, needs supervision From Standing Position, Turn to Look Behind Over each Shoulder: Turn sideways only but maintains balance Turn 360 Degrees: Needs close supervision or verbal cueing Standing Unsupported, Alternately Place  Feet on Step/Stool: Able to complete >2 steps/needs minimal assist Standing Unsupported, One Foot in Front: Needs help to step but can hold 15 seconds Standing on One Leg: Tries to lift leg/unable to hold 3 seconds but remains standing independently Total Score: 34        Cognition Arousal/Alertness: Awake/alert Behavior During Therapy: WFL for tasks assessed/performed Overall Cognitive Status: Within Functional Limits for tasks assessed                                          Exercises  Verbally reviewed car transfer technique for upcoming discharge; patient voiced understanding.    General Comments        Pertinent Vitals/Pain Pain Assessment Pain Assessment: No/denies pain    Home Living                          Prior Function            PT Goals (current goals can now be found in the care plan section) Acute Rehab PT Goals Patient Stated Goal: get better PT Goal Formulation: With patient Time For Goal Achievement: 11/24/21 Potential to Achieve Goals: Good Progress towards PT goals: Progressing toward goals    Frequency    7X/week      PT Plan Current plan remains appropriate    Co-evaluation              AM-PAC PT "6 Clicks" Mobility   Outcome Measure  Help needed turning from your back to your side while in a flat bed without using bedrails?: None Help needed moving from lying on your back to sitting on the side of a flat bed without using bedrails?: None Help needed moving to and from a bed to a chair (including a wheelchair)?: A Little Help needed standing up from a chair using your arms (e.g., wheelchair or bedside chair)?: A Little Help needed to walk in hospital room?: A Little Help needed climbing 3-5 steps with a railing? : A Little 6 Click Score: 20    End of Session Equipment Utilized During Treatment: Gait belt Activity Tolerance: Patient tolerated treatment well Patient left: in bed;with call  bell/phone within reach;with bed alarm set;with family/visitor present Nurse Communication: Mobility status PT Visit Diagnosis: Unsteadiness on feet (R26.81);Other abnormalities of gait and mobility (R26.89);Muscle weakness (generalized) (M62.81)     Time: 8657-8469 PT Time Calculation (min) (ACUTE ONLY): 30 min  Charges:  $Gait Training: 8-22 mins $Neuromuscular Re-education: 8-22 mins                     Imanni Burdine H. Manson Passey, PT, DPT, NCS 11/11/21, 4:27 PM [phone]

## 2021-11-11 NOTE — Final Progress Note (Signed)
Physician Final Progress Note  Patient ID: Robert Grimes MRN: [id number] DOB/AGE: May 08, 1950 71 y.o.  Admit date: 11/09/2021 Admitting provider: Venetia Night, MD Discharge date: 11/11/2021   Admission Diagnoses: Subdural Hematoma  Discharge Diagnoses:  Principal Problem:   Subdural hematoma (HCC)    Consults:  PT/OT  Significant Findings/ Diagnostic Studies: radiology: CT scan:   Narrative & Impression  CLINICAL DATA:  Headache, mild trauma, occipital fracture and subdural fluid collections on previous head CT   EXAM: CT HEAD WITHOUT CONTRAST   TECHNIQUE: Contiguous axial images were obtained from the base of the skull through the vertex without intravenous contrast.   RADIATION DOSE REDUCTION: This exam was performed according to the departmental dose-optimization program which includes automated exposure control, adjustment of the mA and/or kV according to patient size and/or use of iterative reconstruction technique.   COMPARISON:  10/24/2021 at 11:24 a.m.   FINDINGS: Brain: Bilateral low-attenuation extra-axial fluid collections are again identified along the cerebral convexities, measuring up to 8 mm on the left and 6 mm on the right, not appreciably changed from previous study. 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 No acute extra-axial hemorrhage is identified.   No evidence of acute infarct. The lateral ventricles and midline structures are unremarkable. Minimal mass effect upon the left cerebral convexity due to the extra-axial low-attenuation collection described above. No midline shift.   Vascular: No hyperdense vessel or unexpected calcification.   Skull: Nondisplaced occipital fracture in the sagittal plane again identified, stable. No other acute or destructive bony abnormalities.   Sinuses/Orbits: No acute finding.   Other: None.   IMPRESSION: 1. 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 No acute extra-axial hemorrhage. 2. No evidence of acute infarct. 3. Stable nondisplaced occipital fracture. Please see previous discussion for recommendation of CT venogram follow-up.     Electronically Signed   By: Sharlet Salina M.D.   On: 10/24/2021 18:25     Procedures: Subdural Hematoma Evacuation via burr holes 11/09/21  Discharge Condition: fair  Disposition: Discharge disposition: 01-Home or Self Care       Diet: Regular diet  Discharge Activity: No lifting, driving, or strenuous exercise until seen in clinic  Discharge Instructions     Call MD for:  extreme fatigue   Complete by: As directed    Call MD for:  persistant dizziness or light-headedness   Complete by: As directed    Call MD for:  persistant nausea and vomiting   Complete by: As directed    Call MD for:  redness, tenderness, or signs of infection (pain, swelling, redness, odor or green/yellow discharge around incision site)   Complete by: As directed    Call MD for:  severe uncontrolled pain   Complete by: As directed    Call MD for:  temperature >100.4   Complete by: As directed    Diet - low sodium heart healthy   Complete by: As directed    Discharge instructions   Complete by: As directed    NEUROSURGERY DISCHARGE INSTRUCTIONS  Admission diagnosis: Subdural hematoma (HCC) [S06.5XAA]  Operative procedure: [facility] Drainage of Subdural Hematoma  What to do after you leave the hospital:  Recommended diet: regular diet. Increase protein intake to promote wound healing.  Recommended activity:  No driving for 6 weeks.You should walk multiple times per day  Special Instructions  No straining, no heavy lifting > 10lbs x 4 weeks.  Keep incision area clean and dry. May shower in 2 days. No baths or pools for 6 weeks.  no need to apply a bandage  to wound  afterwards  Your sutures are dissolvable   Please take pain medications as directed. Take a stool softener if on pain medications   Please Report any of the following: Nausea or Vomiting, Temperature is greater than 101.31F (38.1C) degrees, Dizziness, Abdominal Pain, Difficulty Breathing or Shortness of Breath, Inability to Eat, drink Fluids, or Take medications, Bleeding, swelling, or drainage from surgical incision sites, New numbness or weakness, and Bowel or bladder dysfunction to the neurosurgeon on call at [phone]  Additional Follow up appointments Please follow up, your appointment scheduled in 2-3 weeks You will need to see urology to help manage your voiding   Driving Restrictions   Complete by: As directed    No Driving until seen in clinic   No wound care   Complete by: As directed       Allergies as of 11/11/2021   No Known Allergies      Medication List     TAKE these medications    amLODipine 2.5 MG tablet Commonly known as: NORVASC Take 2.5 mg by mouth in the morning and at bedtime.   ketoconazole 2 % shampoo Commonly known as: NIZORAL Apply 1 Application topically daily as needed for itching.   latanoprost 0.005 % ophthalmic solution Commonly known as: XALATAN Place 1 drop into both eyes at bedtime.   lisinopril-hydrochlorothiazide 20-12.5 MG tablet Commonly known as: ZESTORETIC Take 1 tablet by mouth daily.   rosuvastatin 5 MG tablet Commonly known as: CRESTOR Take 5 mg by mouth once a week.               Durable Medical Equipment  (From admission, onward)           Start     Ordered   11/10/21 1600  For home use only DME 3 n 1  Once        11/10/21 1559             Total time spent taking care of this patient: 60 minutes  Signed: Loreen Freud 11/11/2021, 3:41 PM

## 2021-11-11 NOTE — Discharge Instructions (Addendum)
NEUROSURGERY DISCHARGE INSTRUCTIONS  Admission diagnosis: Subdural hematoma (HCC) [S06.5XAA]  Operative procedure: [facility] Evacuation of Subdural Hematoma  What to do after you leave the hospital:  Recommended diet: regular diet. Increase protein intake to promote wound healing.  Recommended activity: No driving for 6 weeks.You should walk multiple times per day  Special Instructions  No straining, no heavy lifting > 10lbs x 4 weeks.  Keep incision area clean and dry. May shower in 2 days. No baths or pools for 6 weeks.  no need to apply a bandage to area  Your sutures will dissolve on their own  Please take pain medications as directed. Take a stool softener if on pain medications   Please Report any of the following: Nausea or Vomiting, Temperature is greater than 101.75F (38.1C) degrees, Dizziness, Abdominal Pain, Difficulty Breathing or Shortness of Breath, Inability to Eat, drink Fluids, or Take medications, Bleeding, swelling, or drainage from surgical incision sites, New numbness or weakness, and Bowel or bladder dysfunction to the neurosurgeon on call at [phone]  Additional Follow up appointments Please follow up as scheduled in 2-3 weeks   Please see below for scheduled appointments:  Future Appointments  Date Time Provider Department Center  11/21/2021  3:00 PM Drake Leach, PA-C [facility] None    We will help you plan a follow up with the urology team to help with your foley catheter

## 2021-11-11 NOTE — TOC Initial Note (Signed)
Transition of Care [facility]) - Initial/Assessment Note    Patient Details  Name: Robert Grimes MRN: [id number] Date of Birth: 1950/05/17  Transition of Care [facility]) CM/SW Contact:    Robert Durie, RN Phone Number: 11/11/2021, 2:45 PM  Clinical Narrative:                  Spoke with patient's son, Robert Grimes.  Patient lives alone but son lives down the street.  He provides transportation to MD appointments and assists with other things as needed.  PCP is Robert Grimes, obtains medications from [facility].  Advised son that DME is recommended, 3n1, he report patient already has that as well as walker, cane, and shower chair.    PT/OT recommends outpatient rehab services.  Son state patient has been here to [facility] campus for therapy previously, would like to use facility again.  Will place referral.   Expected Discharge Plan: Home/Self Care Barriers to Discharge: Continued Medical Work up   Patient Goals and CMS Choice Patient states their goals for this hospitalization and ongoing recovery are:: Home, outpatient PT/OT CMS Medicare.gov Compare Post Acute Care list provided to:: Patient    Expected Discharge Plan and Services Expected Discharge Plan: Home/Self Care       Living arrangements for the past 2 months: Single Family Home                                      Prior Living Arrangements/Services Living arrangements for the past 2 months: Single Family Home Lives with:: Self Patient language and need for interpreter reviewed:: Yes Do you feel safe going back to the place where you live?: Yes      Need for Family Participation in Patient Care: Yes (Comment) Care giver support system in place?: Yes (comment) Current home services: DME Criminal Activity/Legal Involvement Pertinent to Current Situation/Hospitalization: No - Comment as needed  Activities of Daily Living Home Assistive Devices/Equipment: None ADL Screening (condition at time of admission) Patient's cognitive  ability adequate to safely complete daily activities?: Yes Is the patient deaf or have difficulty hearing?: No Does the patient have difficulty seeing, even when wearing glasses/contacts?: No Does the patient have difficulty concentrating, remembering, or making decisions?: No Patient able to express need for assistance with ADLs?: Yes Does the patient have difficulty dressing or bathing?: No Independently performs ADLs?: Yes (appropriate for developmental age) Does the patient have difficulty walking or climbing stairs?: No Weakness of Legs: None Weakness of Arms/Hands: None  Permission Sought/Granted Permission sought to share information with : Case Manager Permission granted to share information with : Yes, Verbal Permission Granted     Permission granted to share info w AGENCY: [facility] outpatient rehab        Emotional Assessment       Orientation: : Oriented to Self, Oriented to Place, Oriented to  Time, Oriented to Situation   Psych Involvement: No (comment)  Admission diagnosis:  Subdural hematoma (HCC) [S06.5XAA] Patient Active Problem List   Diagnosis Date Noted   Subdural hematoma (HCC) 11/09/2021   Elevated PSA 12/14/2019   Benign prostatic hyperplasia with urinary frequency 12/14/2019   PCP:  Lynnea Ferrier, MD Pharmacy:   [facility] [address] [place] - [address] [place] [address] Phone: [phone] Fax: [phone]     Social Determinants of Health (SDOH) Interventions    Readmission Risk Interventions  No data to display

## 2021-11-13 ENCOUNTER — Telehealth: Payer: Self-pay

## 2021-11-13 DIAGNOSIS — S065XAA Traumatic subdural hemorrhage with loss of consciousness status unknown, initial encounter: Secondary | ICD-10-CM

## 2021-11-13 DIAGNOSIS — R339 Retention of urine, unspecified: Secondary | ICD-10-CM

## 2021-11-13 NOTE — Telephone Encounter (Signed)
-----   Message from Venetia Night, MD sent at 11/13/2021  7:05 AM EST ----- Can you check in with them and refer to urology for void trial?  Thx

## 2021-11-13 NOTE — Telephone Encounter (Signed)
Referral placed to urology. Repeat head CT ordered. I spoke with pt's daughter in law. He is already scheduled for an appt with urology on 11/20/21. I provided her w/ the # to radiology scheduling and explained that he will need to have the repeat head CT a couple of days before his appt with Dr Myer Haff. I encouraged her to call us with any questions or concerns.

## 2021-11-15 ENCOUNTER — Ambulatory Visit: Payer: PPO | Admitting: Occupational Therapy

## 2021-11-15 ENCOUNTER — Other Ambulatory Visit: Payer: Self-pay

## 2021-11-15 MED ORDER — MIRABEGRON ER 25 MG PO TB24: 25.0000 mg | ORAL_TABLET | Freq: Once | ORAL | 0 refills | Status: DC

## 2021-11-16 ENCOUNTER — Ambulatory Visit: Payer: PPO | Admitting: Occupational Therapy

## 2021-11-16 ENCOUNTER — Telehealth: Payer: Self-pay

## 2021-11-16 NOTE — Telephone Encounter (Signed)
Robert Grimes has been notified that Dr.Yarbrough has sent an order to [facility]. Meg from [facility] has been notified also.

## 2021-11-16 NOTE — Telephone Encounter (Signed)
-----   Message from Cristin E Ray sent at 11/16/2021  9:39 AM EST ----- Regarding: Occupational Therapy Has been scheduled for Occupational [facility]pay per the request "Neuro OT treatment". Patient is unable to drive and his daughter in law/ caregiver is wondering if we could get the request switched to home therapy.  [phone] Selena Batten Lesko

## 2021-11-16 NOTE — Telephone Encounter (Signed)
Dr Myer Haff said we can refer him for [facility] and OT. I notified Meg w/ [facility].

## 2021-11-20 ENCOUNTER — Ambulatory Visit: Payer: PPO | Admitting: Physician Assistant

## 2021-11-20 DIAGNOSIS — R339 Retention of urine, unspecified: Secondary | ICD-10-CM

## 2021-11-20 LAB — BLADDER SCAN AMB NON-IMAGING: Scan Result: 206

## 2021-11-20 NOTE — Progress Notes (Signed)
Catheter Removal  Patient is present today for a catheter removal.  9ml of water was drained from the balloon. A 16FR foley cath was removed from the bladder, no complications were noted. Patient tolerated well.  Performed by: Deaundre Allston, CMA  Follow up/ Additional notes: PM PVR  

## 2021-11-20 NOTE — Progress Notes (Unsigned)
   REFERRING PHYSICIAN:  Lynnea Ferrier, Md [address] [facility] [place],  [place] [address]  DOS: Left frontal burr hole for drainage of subdural hematoma on 11/09/21.   HISTORY OF PRESENT ILLNESS: Robert Grimes is 2 weeks status post Left frontal burr hole for drainage of subdural hematoma. Followed up with outpatient urology on 11/20/21 for postop urinary retention.   He has some intermittent pain in his head that was there prior to surgery. He's had this since his fall. He notes intermittent headaches that are better than prior to his surgery.    PHYSICAL EXAMINATION:  NEUROLOGICAL:  General: In no acute distress.   Awake, alert, oriented to person, place, and time.  Pupils equal round and reactive to light.  Facial tone is symmetric. Shoulder shrug is symmetric. Tongue protrusion is midline.  There is no pronator drift.   Strength: Side Biceps Triceps Deltoid Interossei Grip Wrist Ext. Wrist Flex.  R 5 5 5 5 5 5 5   L 5 5 5 5 5 5 5    Incisions c/d/i   Imaging:  Nothing new to review.   Assessment / Plan: Robert Grimes is doing well s/p above surgery. Treatment options reviewed with patient and following plan made:   - Avoid strenuous activity. No heavy lifting greater than 10 pounds until until your follow up with Dr. on 12/21/21.  - Repeat CT scan of head as scheduled on 12/19/21.  - Reviewed wound care.  - Continue current medications including prn tylenol.  - Follow up as scheduled in 4 weeks and prn.   Advised to contact the office if any questions or concerns arise.   12/23/21 PA-C Dept of Neurosurgery

## 2021-11-20 NOTE — Progress Notes (Signed)
11/20/2021 1:16 PM   Robert Grimes 1950-05-02 [id number]  CC: Chief Complaint  Patient presents with   Urinary Retention   HPI: Robert Grimes is a 71 y.o. male with PMH BPH, OSA, and elevated PSA who underwent subdural hematoma drainage with Dr. Myer Haff on 11/09/2021 and developed postop urinary retention with drainage of 800 mL of urine who presents today for voiding trial.  He is accompanied today by his son, who contributes to HPI.  Today he reports no acute concerns.  He denies past episodes of urinary retention.  While Dr. Lonna Cobb had previously offered him a trial of Flomax, he has taken this in quite some time.  He denies any voiding symptoms at baseline.  Foley catheter removed in the morning, see separate procedure note for details.  He returned to clinic in the afternoon for PVR.  He has voided several times without difficulty, PVR 206 mL.  PMH: Past Medical History:  Diagnosis Date   CTS (carpal tunnel syndrome)    DDD (degenerative disc disease), lumbar    Elevated PSA    Hyperglycemia    Hyperlipidemia    Hypertension    OSA (obstructive sleep apnea)    Renal stones    Sleep apnea     Surgical History: Past Surgical History:  Procedure Laterality Date   BURR HOLE Left 11/09/2021   Procedure: BURR HOLES;  Surgeon: Venetia Night, MD;  Location: [facility] ORS;  Service: Neurosurgery;  Laterality: Left;   CATARACT EXTRACTION     CHOLECYSTECTOMY     HERNIA REPAIR     SPINE SURGERY      Home Medications:  Allergies as of 11/20/2021   No Known Allergies      Medication List        Accurate as of November 20, 2021  1:16 PM. If you have any questions, ask your nurse or doctor.          amLODipine 2.5 MG tablet Commonly known as: NORVASC Take 2.5 mg by mouth in the morning and at bedtime.   ketoconazole 2 % shampoo Commonly known as: NIZORAL Apply 1 Application topically daily as needed for itching.   latanoprost 0.005 % ophthalmic  solution Commonly known as: XALATAN Place 1 drop into both eyes at bedtime.   lisinopril-hydrochlorothiazide 20-12.5 MG tablet Commonly known as: ZESTORETIC Take 1 tablet by mouth daily.   mirabegron ER 25 MG Tb24 tablet Commonly known as: MYRBETRIQ Take 1 tablet (25 mg total) by mouth one time only at 4 PM for 1 dose.   rosuvastatin 5 MG tablet Commonly known as: CRESTOR Take 5 mg by mouth once a week.        Allergies:  No Known Allergies  Family History: No family history on file.  Social History:   reports that he has never smoked. He has never used smokeless tobacco. He reports that he does not drink alcohol. No history on file for drug use.  Physical Exam: There were no vitals taken for this visit.  Constitutional:  Alert and oriented, no acute distress, nontoxic appearing HEENT: [place], AT Cardiovascular: No clubbing, cyanosis, or edema Respiratory: Normal respiratory effort, no increased work of breathing Skin: No rashes, bruises or suspicious lesions Neurologic: Grossly intact, no focal deficits, moving all 4 extremities Psychiatric: Normal mood and affect  Laboratory Data: Results for orders placed or performed in visit on 11/20/21  Bladder Scan (Post Void Residual) in office  Result Value Ref Range   Scan Result 206  Assessment & Plan:   1. Urinary retention Voiding trial passed.  Will defer pharmacotherapy for BPH as he denies bothersome baseline urinary symptoms.  We will plan for symptom recheck with IPSS and PVR in 4 weeks with Dr. Lonna Cobb.  He is in agreement with this plan. - Bladder Scan (Post Void Residual) in office  Return in about 4 weeks (around 12/18/2021) for Symptom recheck with IPSS, PVR.  Carman Ching, PA-C  [facility] [address] [place], [place] [address] [phone]

## 2021-11-21 ENCOUNTER — Encounter: Payer: Self-pay | Admitting: Orthopedic Surgery

## 2021-11-21 ENCOUNTER — Ambulatory Visit (INDEPENDENT_AMBULATORY_CARE_PROVIDER_SITE_OTHER): Payer: PPO | Admitting: Orthopedic Surgery

## 2021-11-21 VITALS — BP 148/74 | HR 107 | Temp 98.7°F

## 2021-11-21 DIAGNOSIS — R2689 Other abnormalities of gait and mobility: Secondary | ICD-10-CM | POA: Diagnosis not present

## 2021-11-21 DIAGNOSIS — R2681 Unsteadiness on feet: Secondary | ICD-10-CM | POA: Diagnosis not present

## 2021-11-21 DIAGNOSIS — Z9889 Other specified postprocedural states: Secondary | ICD-10-CM

## 2021-11-21 DIAGNOSIS — S065XAD Traumatic subdural hemorrhage with loss of consciousness status unknown, subsequent encounter: Secondary | ICD-10-CM

## 2021-11-21 DIAGNOSIS — M5186 Other intervertebral disc disorders, lumbar region: Secondary | ICD-10-CM | POA: Diagnosis not present

## 2021-11-21 DIAGNOSIS — I10 Essential (primary) hypertension: Secondary | ICD-10-CM | POA: Diagnosis not present

## 2021-11-21 DIAGNOSIS — S02119D Unspecified fracture of occiput, subsequent encounter for fracture with routine healing: Secondary | ICD-10-CM | POA: Diagnosis not present

## 2021-11-21 DIAGNOSIS — Z09 Encounter for follow-up examination after completed treatment for conditions other than malignant neoplasm: Secondary | ICD-10-CM

## 2021-11-21 DIAGNOSIS — W19XXXD Unspecified fall, subsequent encounter: Secondary | ICD-10-CM

## 2021-11-21 DIAGNOSIS — Z48811 Encounter for surgical aftercare following surgery on the nervous system: Secondary | ICD-10-CM | POA: Diagnosis not present

## 2021-11-21 DIAGNOSIS — M6281 Muscle weakness (generalized): Secondary | ICD-10-CM | POA: Diagnosis not present

## 2021-11-21 DIAGNOSIS — G4733 Obstructive sleep apnea (adult) (pediatric): Secondary | ICD-10-CM | POA: Diagnosis not present

## 2021-11-21 DIAGNOSIS — S065XAA Traumatic subdural hemorrhage with loss of consciousness status unknown, initial encounter: Secondary | ICD-10-CM

## 2021-11-21 DIAGNOSIS — E785 Hyperlipidemia, unspecified: Secondary | ICD-10-CM | POA: Diagnosis not present

## 2021-11-22 ENCOUNTER — Encounter: Payer: PPO | Admitting: Occupational Therapy

## 2021-11-24 ENCOUNTER — Ambulatory Visit
Admission: EM | Admit: 2021-11-24 | Discharge: 2021-11-24 | Disposition: A | Discharge status: Home / Self Care | Payer: PPO | Attending: Internal Medicine | Admitting: Internal Medicine

## 2021-11-24 ENCOUNTER — Encounter: Payer: Self-pay | Admitting: Emergency Medicine

## 2021-11-24 DIAGNOSIS — N3000 Acute cystitis without hematuria: Secondary | ICD-10-CM | POA: Insufficient documentation

## 2021-11-24 DIAGNOSIS — R351 Nocturia: Secondary | ICD-10-CM | POA: Diagnosis not present

## 2021-11-24 DIAGNOSIS — N401 Enlarged prostate with lower urinary tract symptoms: Secondary | ICD-10-CM | POA: Diagnosis not present

## 2021-11-24 LAB — URINALYSIS, ROUTINE W REFLEX MICROSCOPIC
Bilirubin Urine: NEGATIVE
Glucose, UA: NEGATIVE mg/dL
Ketones, ur: NEGATIVE mg/dL
Nitrite: NEGATIVE
Protein, ur: NEGATIVE mg/dL
Specific Gravity, Urine: 1.01 (ref 1.005–1.030)
pH: 7 (ref 5.0–8.0)

## 2021-11-24 LAB — URINALYSIS, MICROSCOPIC (REFLEX): WBC, UA: >50 WBC/hpf (ref 0–5)

## 2021-11-24 MED ORDER — CIPROFLOXACIN HCL 500 MG PO TABS: 500.0000 mg | ORAL_TABLET | Freq: Two times a day (BID) | ORAL | 0 refills | Status: DC

## 2021-11-24 MED ORDER — TAMSULOSIN HCL 0.4 MG PO CAPS: 0.4000 mg | ORAL_CAPSULE | Freq: Every day | ORAL | 0 refills | Status: DC

## 2021-11-24 NOTE — ED Triage Notes (Signed)
Patient c/o burning when urinating and difficulty urinating for the past 3 days.  Patient states that he had his catheter removed on Monday.  Patient denies fevers.

## 2021-11-24 NOTE — ED Provider Notes (Signed)
[facility]    CSN: [id number] Arrival date & time: 11/24/21  1654      History   Chief Complaint Chief Complaint  Patient presents with   Dysuria    HPI Robert Grimes is a 71 y.o. male with a history of BPH, elevated PSA and recent postop urinary retention status post catheter replacement which was [facility] 11[facility] with complaint of urinary frequency and hesitancy.  He reports this started 2 days ago.  He reports he urinates every hour but does not empty his bladder completely.  His biggest complaint is having to get up multiple times at night to urinate.  He denies urgency, dysuria or blood in his urine.  He denies pelvic pressure or low back pain.  He denies fever, chills, nausea or vomiting.  He has been prescribed Flomax in the past but decided that he did not want to take this daily.  His son reports that he had a prostate biopsy which did not show any evidence of cancer.  HPI  Past Medical History:  Diagnosis Date   CTS (carpal tunnel syndrome)    DDD (degenerative disc disease), lumbar    Elevated PSA    Hyperglycemia    Hyperlipidemia    Hypertension    OSA (obstructive sleep apnea)    Renal stones    Sleep apnea     Patient Active Problem List   Diagnosis Date Noted   Subdural hematoma (HCC) 11/09/2021   Elevated PSA 12/14/2019   Benign prostatic hyperplasia with urinary frequency 12/14/2019    Past Surgical History:  Procedure Laterality Date   BURR HOLE Left 11/09/2021   Procedure: BURR HOLES;  Surgeon: Venetia NightYarbrough, Chester, MD;  Location: [facility] ORS;  Service: Neurosurgery;  Laterality: Left;   CATARACT EXTRACTION     CHOLECYSTECTOMY     HERNIA REPAIR     SPINE SURGERY         Home Medications    Prior to Admission medications   Medication Sig Start Date End Date Taking? Authorizing Provider  amLODipine (NORVASC) 2.5 MG tablet Take 2.5 mg by mouth in the morning and at bedtime. 04/28/19  11/24/21 Yes [provider]  ciprofloxacin (CIPRO) 500 MG tablet Take 1 tablet (500 mg total) by mouth 2 (two) times daily. 11/24/21  Yes Tempie Gibeault, Salvadore Oxfordegina W, NP  dorzolamide-timolol (COSOPT) 2-0.5 % ophthalmic solution Place 1 drop into both eyes 2 (two) times daily. 11/14/21  Yes [provider]  latanoprost (XALATAN) 0.005 % ophthalmic solution Place 1 drop into both eyes at bedtime. 10/06/19  Yes [provider]  lisinopril-hydrochlorothiazide (ZESTORETIC) 20-12.5 MG tablet Take 1 tablet by mouth daily. 09/17/21  Yes [provider]  rosuvastatin (CRESTOR) 5 MG tablet Take 5 mg by mouth once a week. 10/13/21  Yes [provider]  tamsulosin (FLOMAX) 0.4 MG CAPS capsule Take 1 capsule (0.4 mg total) by mouth daily. 11/24/21  Yes Lorre MunroeBaity, Kafi Dotter W, NP    Family History History reviewed. No pertinent family history.  Social History Social History   Tobacco Use   Smoking status: Never   Smokeless tobacco: Never  Vaping Use   Vaping Use: Never used  Substance Use Topics   Alcohol use: No     Allergies   Patient has no known allergies.   Review of Systems Review of Systems   Past Medical History:  Diagnosis Date   CTS (carpal tunnel syndrome)    DDD (degenerative disc disease),  lumbar    Elevated PSA    Hyperglycemia    Hyperlipidemia    Hypertension    OSA (obstructive sleep apnea)    Renal stones    Sleep apnea     No current facility-administered medications for this encounter.   Current Outpatient Medications  Medication Sig Dispense Refill   amLODipine (NORVASC) 2.5 MG tablet Take 2.5 mg by mouth in the morning and at bedtime.     ciprofloxacin (CIPRO) 500 MG tablet Take 1 tablet (500 mg total) by mouth 2 (two) times daily. 14 tablet 0   dorzolamide-timolol (COSOPT) 2-0.5 % ophthalmic solution Place 1 drop into both eyes 2 (two) times daily.     latanoprost (XALATAN) 0.005 % ophthalmic solution Place 1 drop into both eyes  at bedtime.     lisinopril-hydrochlorothiazide (ZESTORETIC) 20-12.5 MG tablet Take 1 tablet by mouth daily.     rosuvastatin (CRESTOR) 5 MG tablet Take 5 mg by mouth once a week.     tamsulosin (FLOMAX) 0.4 MG CAPS capsule Take 1 capsule (0.4 mg total) by mouth daily. 30 capsule 0    No Known Allergies  History reviewed. No pertinent family history.  Social History   Socioeconomic History   Marital status: Married    Spouse name: Not on file   Number of children: Not on file   Years of education: Not on file   Highest education level: Not on file  Occupational History   Not on file  Tobacco Use   Smoking status: Never   Smokeless tobacco: Never  Vaping Use   Vaping Use: Never used  Substance and Sexual Activity   Alcohol use: No   Drug use: Not on file   Sexual activity: Not on file  Other Topics Concern   Not on file  Social History Narrative   Not on file   Social Determinants of Health   Financial Resource Strain: Not on file  Food Insecurity: Not on file  Transportation Needs: Not on file  Physical Activity: Not on file  Stress: Not on file  Social Connections: Not on file  Intimate Partner Violence: Not on file     Constitutional: Denies fever, malaise, fatigue, headache or abrupt weight changes.  Respiratory: Denies difficulty breathing, shortness of breath, cough or sputum production.   Cardiovascular: Denies chest pain, chest tightness, palpitations or swelling in the hands or feet.  Gastrointestinal: Denies abdominal pain, bloating, constipation, diarrhea or blood in the stool.  GU: Patient reports urinary frequency, hesitancy and nocturia.  Denies urgency, pain with urination, burning sensation, blood in urine, odor or discharge. Neurological: Denies dizziness, difficulty with memory, difficulty with speech or problems with balance and coordination.    No other specific complaints in a complete review of systems (except as listed in HPI  above).  Physical Exam Triage Vital Signs ED Triage Vitals  Enc Vitals Group     BP 11/24/21 1711 (!) 144/77     Pulse Rate 11/24/21 1711 84     Resp 11/24/21 1711 15     Temp 11/24/21 1711 97.9 F (36.6 C)     Temp Source 11/24/21 1711 Oral     SpO2 11/24/21 1711 99 %     Weight 11/24/21 1709 160 lb 15 oz (73 kg)     Height 11/24/21 1709 5\' 3"  (1.6 m)     Head Circumference --      Peak Flow --      Pain Score 11/24/21 1709 4  Pain Loc --      Pain Edu? --      Excl. in GC? --    No data found.  Updated Vital Signs BP (!) 144/77 (BP Location: Left Arm)   Pulse 84   Temp 97.9 F (36.6 C) (Oral)   Resp 15   Ht 5\' 3"  (1.6 m)   Wt 160 lb 15 oz (73 kg)   SpO2 99%   BMI 28.51 kg/m      Physical Exam  BP (!) 144/77 (BP Location: Left Arm)   Pulse 84   Temp 97.9 F (36.6 C) (Oral)   Resp 15   Ht 5\' 3"  (1.6 m)   Wt 160 lb 15 oz (73 kg)   SpO2 99%   BMI 28.51 kg/m  Wt Readings from Last 3 Encounters:  11/24/21 160 lb 15 oz (73 kg)  11/10/21 161 lb (73 kg)  11/09/21 161 lb 12.8 oz (73.4 kg)    General: Appears his stated age, well developed, well nourished in NAD. Cardiovascular: Normal rate and rhythm. S1,S2 noted.  No murmur, rubs or gallops noted.  Pulmonary/Chest: Normal effort and positive vesicular breath sounds. No respiratory distress. No wheezes, rales or ronchi noted.  Abdomen: Soft and nontender. Normal bowel sounds. No distention or masses noted.  No CVA tenderness noted. Neurological: Alert and oriented.     UC Treatments / Results  Labs Labs Reviewed  URINALYSIS, ROUTINE W REFLEX MICROSCOPIC - Abnormal; Notable for the following components:      Result Value   APPearance HAZY (*)    Hgb urine dipstick SMALL (*)    Leukocytes,Ua LARGE (*)    All other components within normal limits  URINALYSIS, MICROSCOPIC (REFLEX) - Abnormal; Notable for the following components:   Bacteria, UA MANY (*)    All other components within normal limits      Medications Ordered in UC Medications - No data to display  Initial Impression / Assessment and Plan / UC Course  I have reviewed the triage vital signs and the nursing notes.  Pertinent labs & imaging results that were available during my care of the patient were reviewed by me and considered in my medical decision making (see chart for details).     71 year old male presents to UC today with complaint of urinary frequency, hesitancy, nocturia.  He has a history of BPH, elevated PSA without active prostate cancer and recent postop urinary retention requiring catheterization which was just removed 11/20.  Urinalysis does show evidence of small blood and large leukocytes with many bacteria.  Advised him that his symptoms are most likely related to his BPH and advised him that he will need to be on Flomax 0.4 mg daily for the rest of his life.  No indication for repeat catheterization at this time.  We will also treat for presumed UTI with Cipro 500 mg twice daily x7 days.  Encourage adequate fluid intake.  He already has a urologist that he can follow-up with as an outpatient.  Final Clinical Impressions(s) / UC Diagnoses   Final diagnoses:  Benign prostatic hyperplasia with nocturia  Acute cystitis without hematuria     Discharge Instructions      You were seen today for urinary symptoms.  Your urine is concerning for a urinary/prostate infection.)  You on antibiotics twice daily for the next 7 days.  I have also sent in Flomax for you to take daily.  This helps reduce your enlarged prostate.  I encouraged adequate  water intake.  Please call urology and schedule a follow-up if your symptoms persist.     ED Prescriptions     Medication Sig Dispense Auth. Provider   ciprofloxacin (CIPRO) 500 MG tablet Take 1 tablet (500 mg total) by mouth 2 (two) times daily. 14 tablet Lorre Munroe, NP   tamsulosin (FLOMAX) 0.4 MG CAPS capsule Take 1 capsule (0.4 mg total) by mouth daily. 30  capsule Lorre Munroe, NP      PDMP not reviewed this encounter.   Lorre Munroe, NP 11/24/21 1744

## 2021-11-24 NOTE — Discharge Instructions (Signed)
You were seen today for urinary symptoms.  Your urine is concerning for a urinary/prostate infection.)  You on antibiotics twice daily for the next 7 days.  I have also sent in Flomax for you to take daily.  This helps reduce your enlarged prostate.  I encouraged adequate water intake.  Please call urology and schedule a follow-up if your symptoms persist.

## 2021-11-27 ENCOUNTER — Encounter: Payer: PPO | Admitting: Occupational Therapy

## 2021-11-27 LAB — URINE CULTURE: Culture: >=100000 — AB

## 2021-11-29 ENCOUNTER — Encounter: Payer: PPO | Admitting: Occupational Therapy

## 2021-12-04 ENCOUNTER — Encounter: Payer: PPO | Admitting: Occupational Therapy

## 2021-12-06 ENCOUNTER — Encounter: Payer: PPO | Admitting: Occupational Therapy

## 2021-12-06 DIAGNOSIS — R2681 Unsteadiness on feet: Secondary | ICD-10-CM | POA: Diagnosis not present

## 2021-12-06 DIAGNOSIS — R2689 Other abnormalities of gait and mobility: Secondary | ICD-10-CM | POA: Diagnosis not present

## 2021-12-06 DIAGNOSIS — S065XAD Traumatic subdural hemorrhage with loss of consciousness status unknown, subsequent encounter: Secondary | ICD-10-CM | POA: Diagnosis not present

## 2021-12-06 DIAGNOSIS — M5186 Other intervertebral disc disorders, lumbar region: Secondary | ICD-10-CM | POA: Diagnosis not present

## 2021-12-06 DIAGNOSIS — S02119D Unspecified fracture of occiput, subsequent encounter for fracture with routine healing: Secondary | ICD-10-CM | POA: Diagnosis not present

## 2021-12-06 DIAGNOSIS — I10 Essential (primary) hypertension: Secondary | ICD-10-CM | POA: Diagnosis not present

## 2021-12-06 DIAGNOSIS — M6281 Muscle weakness (generalized): Secondary | ICD-10-CM | POA: Diagnosis not present

## 2021-12-06 DIAGNOSIS — G4733 Obstructive sleep apnea (adult) (pediatric): Secondary | ICD-10-CM | POA: Diagnosis not present

## 2021-12-06 DIAGNOSIS — E785 Hyperlipidemia, unspecified: Secondary | ICD-10-CM | POA: Diagnosis not present

## 2021-12-06 DIAGNOSIS — Z48811 Encounter for surgical aftercare following surgery on the nervous system: Secondary | ICD-10-CM | POA: Diagnosis not present

## 2021-12-11 ENCOUNTER — Encounter: Payer: PPO | Admitting: Occupational Therapy

## 2021-12-12 IMAGING — MR MR PROSTATE WO/W CM
56 series · 56 of 56 positions shown · IV contrast (7.5ml Gadavist)
Comparison: None.

CLINICAL DATA: 70-year-old male with elevated prostate specific
antigen. Negative biopsy 15 years prior.

EXAM:
MR PROSTATE WITHOUT AND WITH CONTRAST
TECHNIQUE: Multiplanar multisequence MRI images were obtained of the pelvis
centered about the prostate. Pre and post contrast images were
obtained.
CONTRAST:  7mL GADAVIST GADOBUTROL 1 MMOL/ML IV SOLN

[Series 4: ax in&out whole · axial · 5.0mm · 0.74mm/px · 1 of 70 slices shown]
[im 1/70]
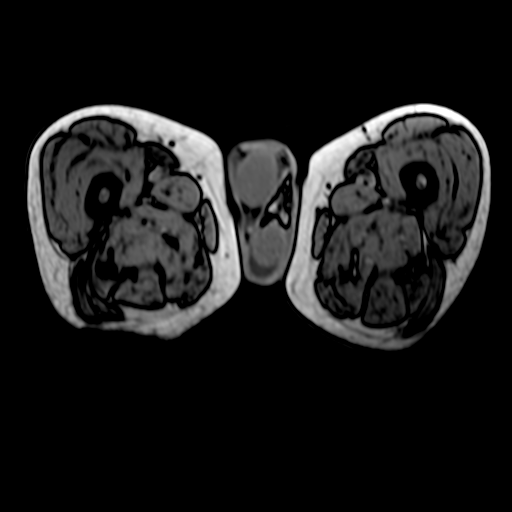

[Series 5: T2 · axial · 3.0mm · 0.56mm/px · 1 of 27 slices shown (1 of 3)]
[im 1/27]
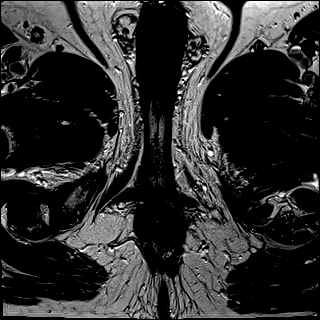

[Series 6: T2 · coronal · 3.0mm · 0.70mm/px · 1 of 35 slices shown (2 of 3)]
[im 1/35]
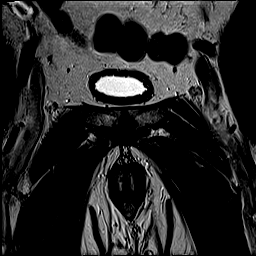

[Series 7: DWI · axial · 3.0mm · 0.86mm/px · 1 of 81 slices shown (1 of 3)]
[im 1/81]
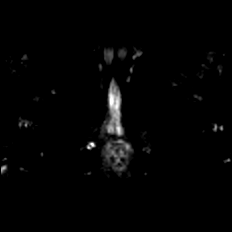

[Series 8: DWI · axial · 3.0mm · 0.86mm/px · 1 of 27 slices shown (2 of 3)]
[im 1/27]
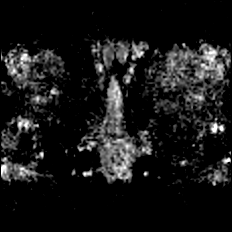

[Series 9: DWI · axial · 3.0mm · 0.86mm/px · 1 of 27 slices shown (3 of 3)]
[im 1/27]
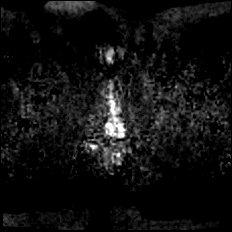

[Series 10: T2 · axial · 1.0mm · 1.04mm/px · 1 of 72 slices shown (3 of 3)]
[im 1/72]
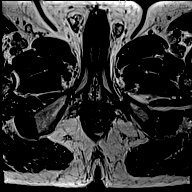

[Series 11: T1 · axial · 3.0mm · 1.15mm/px · 1 of 30 slices shown (1 of 49)]
[im 1/30]
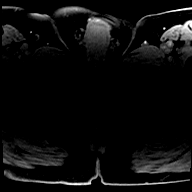

[Series 12: T1 · axial · 3.0mm · 1.15mm/px · 1 of 30 slices shown (2 of 49)]
[im 1/30]
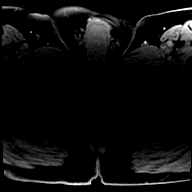

[Series 13: T1 · axial · 3.0mm · 1.15mm/px · 1 of 30 slices shown (3 of 49)]
[im 1/30]
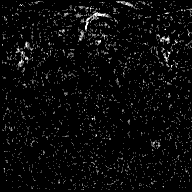

[Series 14: T1 · axial · 3.0mm · 1.15mm/px · 1 of 30 slices shown (4 of 49)]
[im 1/30]
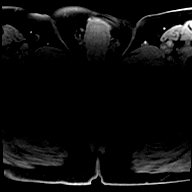

[Series 15: T1 · axial · 3.0mm · 1.15mm/px · 1 of 30 slices shown (5 of 49)]
[im 1/30]
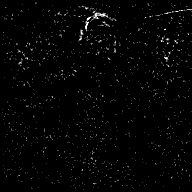

[Series 16: T1 · axial · 3.0mm · 1.15mm/px · 1 of 30 slices shown (6 of 49)]
[im 1/30]
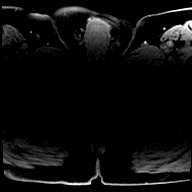

[Series 17: T1 · axial · 3.0mm · 1.15mm/px · 1 of 30 slices shown (7 of 49)]
[im 1/30]
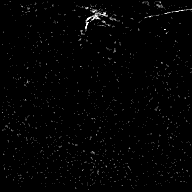

[Series 18: T1 · axial · 3.0mm · 1.15mm/px · 1 of 30 slices shown (8 of 49)]
[im 1/30]
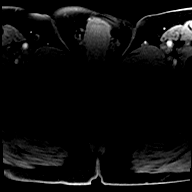

[Series 19: T1 · axial · 3.0mm · 1.15mm/px · 1 of 30 slices shown (9 of 49)]
[im 1/30]
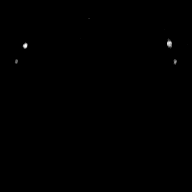

[Series 20: T1 · axial · 3.0mm · 1.15mm/px · 1 of 30 slices shown (10 of 49)]
[im 1/30]
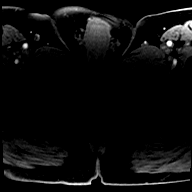

[Series 21: T1 · axial · 3.0mm · 1.15mm/px · 1 of 30 slices shown (11 of 49)]
[im 1/30]
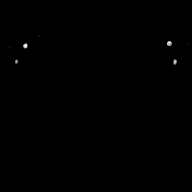

[Series 22: T1 · axial · 3.0mm · 1.15mm/px · 1 of 30 slices shown (12 of 49)]
[im 1/30]
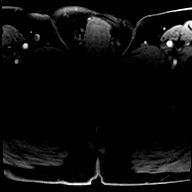

[Series 23: T1 · axial · 3.0mm · 1.15mm/px · 1 of 30 slices shown (13 of 49)]
[im 1/30]
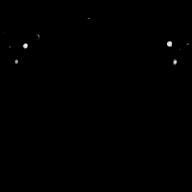

[Series 24: T1 · axial · 3.0mm · 1.15mm/px · 1 of 30 slices shown (14 of 49)]
[im 1/30]
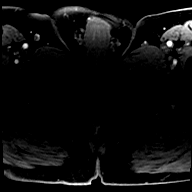

[Series 25: T1 · axial · 3.0mm · 1.15mm/px · 1 of 30 slices shown (15 of 49)]
[im 1/30]
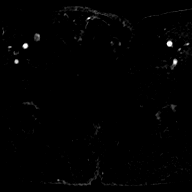

[Series 26: T1 · axial · 3.0mm · 1.15mm/px · 1 of 30 slices shown (16 of 49)]
[im 1/30]
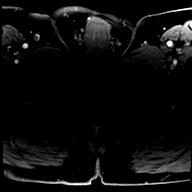

[Series 27: T1 · axial · 3.0mm · 1.15mm/px · 1 of 30 slices shown (17 of 49)]
[im 1/30]
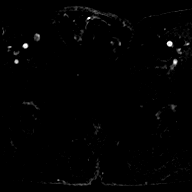

[Series 28: T1 · axial · 3.0mm · 1.15mm/px · 1 of 30 slices shown (18 of 49)]
[im 1/30]
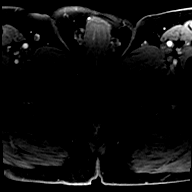

[Series 29: T1 · axial · 3.0mm · 1.15mm/px · 1 of 30 slices shown (19 of 49)]
[im 1/30]
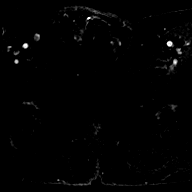

[Series 30: T1 · axial · 3.0mm · 1.15mm/px · 1 of 30 slices shown (20 of 49)]
[im 1/30]
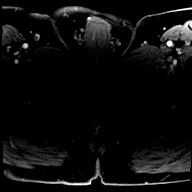

[Series 31: T1 · axial · 3.0mm · 1.15mm/px · 1 of 30 slices shown (21 of 49)]
[im 1/30]
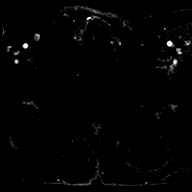

[Series 32: T1 · axial · 3.0mm · 1.15mm/px · 1 of 30 slices shown (22 of 49)]
[im 1/30]
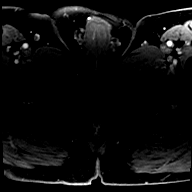

[Series 33: T1 · axial · 3.0mm · 1.15mm/px · 1 of 30 slices shown (23 of 49)]
[im 1/30]
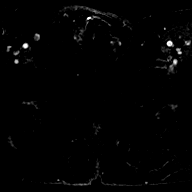

[Series 34: T1 · axial · 3.0mm · 1.15mm/px · 1 of 30 slices shown (24 of 49)]
[im 1/30]
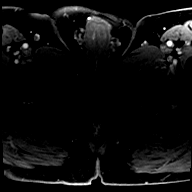

[Series 35: T1 · axial · 3.0mm · 1.15mm/px · 1 of 30 slices shown (25 of 49)]
[im 1/30]
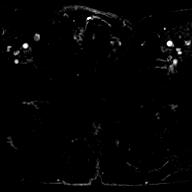

[Series 36: T1 · axial · 3.0mm · 1.15mm/px · 1 of 30 slices shown (26 of 49)]
[im 1/30]
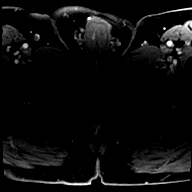

[Series 37: T1 · axial · 3.0mm · 1.15mm/px · 1 of 30 slices shown (27 of 49)]
[im 1/30]
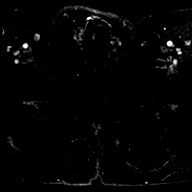

[Series 38: T1 · axial · 3.0mm · 1.15mm/px · 1 of 30 slices shown (28 of 49)]
[im 1/30]
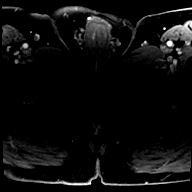

[Series 39: T1 · axial · 3.0mm · 1.15mm/px · 1 of 30 slices shown (29 of 49)]
[im 1/30]
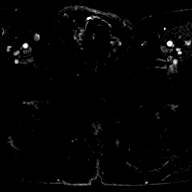

[Series 40: T1 · axial · 3.0mm · 1.15mm/px · 1 of 30 slices shown (30 of 49)]
[im 1/30]
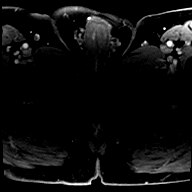

[Series 41: T1 · axial · 3.0mm · 1.15mm/px · 1 of 30 slices shown (31 of 49)]
[im 1/30]
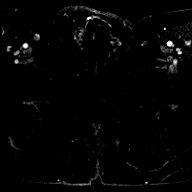

[Series 42: T1 · axial · 3.0mm · 1.15mm/px · 1 of 30 slices shown (32 of 49)]
[im 1/30]
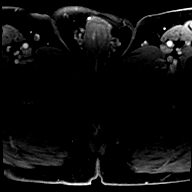

[Series 43: T1 · axial · 3.0mm · 1.15mm/px · 1 of 30 slices shown (33 of 49)]
[im 1/30]
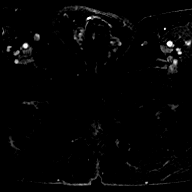

[Series 44: T1 · axial · 3.0mm · 1.15mm/px · 1 of 30 slices shown (34 of 49)]
[im 1/30]
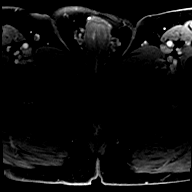

[Series 45: T1 · axial · 3.0mm · 1.15mm/px · 1 of 30 slices shown (35 of 49)]
[im 1/30]
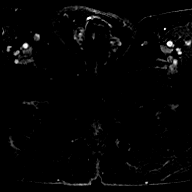

[Series 46: T1 · axial · 3.0mm · 1.15mm/px · 1 of 30 slices shown (36 of 49)]
[im 1/30]
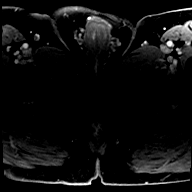

[Series 47: T1 · axial · 3.0mm · 1.15mm/px · 1 of 30 slices shown (37 of 49)]
[im 1/30]
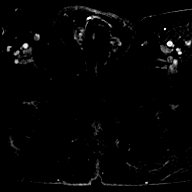

[Series 48: T1 · axial · 3.0mm · 1.15mm/px · 1 of 30 slices shown (38 of 49)]
[im 1/30]
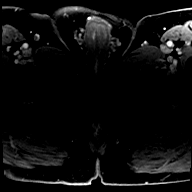

[Series 49: T1 · axial · 3.0mm · 1.15mm/px · 1 of 30 slices shown (39 of 49)]
[im 1/30]
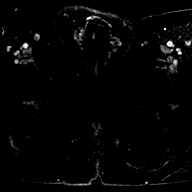

[Series 50: T1 · axial · 3.0mm · 1.15mm/px · 1 of 30 slices shown (40 of 49)]
[im 1/30]
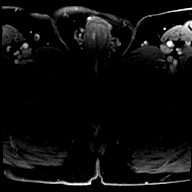

[Series 51: T1 · axial · 3.0mm · 1.15mm/px · 1 of 30 slices shown (41 of 49)]
[im 1/30]
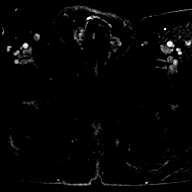

[Series 52: T1 · axial · 3.0mm · 1.15mm/px · 1 of 30 slices shown (42 of 49)]
[im 1/30]
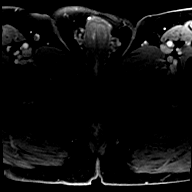

[Series 53: T1 · axial · 3.0mm · 1.15mm/px · 1 of 30 slices shown (43 of 49)]
[im 1/30]
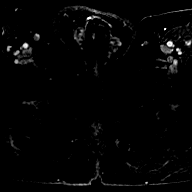

[Series 54: T1 · axial · 3.0mm · 1.15mm/px · 1 of 30 slices shown (44 of 49)]
[im 1/30]
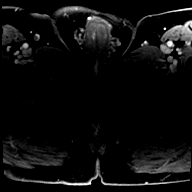

[Series 55: T1 · axial · 3.0mm · 1.15mm/px · 1 of 30 slices shown (45 of 49)]
[im 1/30]
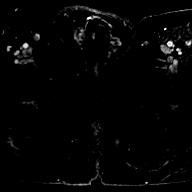

[Series 56: T1 · axial · 3.0mm · 1.15mm/px · 1 of 30 slices shown (46 of 49)]
[im 1/30]
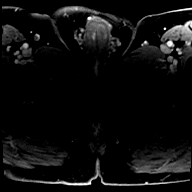

[Series 57: T1 · axial · 3.0mm · 1.15mm/px · 1 of 30 slices shown (47 of 49)]
[im 1/30]
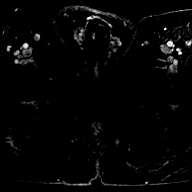

[Series 58: T1 · axial · 3.0mm · 1.15mm/px · 1 of 30 slices shown (48 of 49)]
[im 1/30]
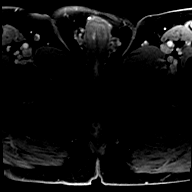

[Series 59: T1 · axial · 3.0mm · 1.15mm/px · 1 of 30 slices shown (49 of 49)]
[im 1/30]
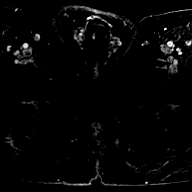

[56 of 56 positions shown; findings below may reference images not displayed]

FINDINGS: Prostate: No focal lesion identified in the peripheral zone T2
weighted imaging. Linear striations are favored benign (series 5).

No foci of restricted diffusion in the peripheral zone.

Transitional zone is enlarged by capsulated nodules. No suspicious
imaging findings on T2 weighted imaging (series 5)

The prostatic capsule is intact.  Seminal vesicles are normal.

No suspicious enhancement pattern

Volume: 5.0 x 5.4 x 4.7 cm (volume = 66 cm^3)

Transcapsular spread:  Absent

Seminal vesicle involvement: Absent

Neurovascular bundle involvement: Absent

Pelvic adenopathy: Absent

Bone metastasis: Absent

Other findings: None
IMPRESSION: 1. No high-grade carcinoma the peripheral zone. Linear striations
are favored benign post inflammatory. PI-RADS: 2
2. Enlarged nodular transitional zone most consistent benign
prostate hypertrophy. PI-RADS: 2

## 2021-12-13 ENCOUNTER — Encounter: Payer: PPO | Admitting: Occupational Therapy

## 2021-12-18 ENCOUNTER — Encounter: Payer: PPO | Admitting: Occupational Therapy

## 2021-12-19 ENCOUNTER — Ambulatory Visit
Admission: RE | Admit: 2021-12-19 | Discharge: 2021-12-19 | Disposition: A | Discharge status: Home / Self Care | Payer: PPO | Source: Ambulatory Visit | Attending: Neurosurgery | Admitting: Neurosurgery

## 2021-12-19 DIAGNOSIS — S065XAA Traumatic subdural hemorrhage with loss of consciousness status unknown, initial encounter: Secondary | ICD-10-CM | POA: Diagnosis not present

## 2021-12-19 DIAGNOSIS — I6200 Nontraumatic subdural hemorrhage, unspecified: Secondary | ICD-10-CM | POA: Diagnosis not present

## 2021-12-19 DIAGNOSIS — G9389 Other specified disorders of brain: Secondary | ICD-10-CM | POA: Diagnosis not present

## 2021-12-20 ENCOUNTER — Encounter: Payer: PPO | Admitting: Occupational Therapy

## 2021-12-20 ENCOUNTER — Ambulatory Visit: Payer: PPO | Admitting: Urology

## 2021-12-20 ENCOUNTER — Encounter: Payer: Self-pay | Admitting: Urology

## 2021-12-20 VITALS — BP 148/79 | HR 77 | Ht 63.0 in | Wt 162.0 lb

## 2021-12-20 DIAGNOSIS — R339 Retention of urine, unspecified: Secondary | ICD-10-CM

## 2021-12-20 DIAGNOSIS — N401 Enlarged prostate with lower urinary tract symptoms: Secondary | ICD-10-CM | POA: Diagnosis not present

## 2021-12-20 DIAGNOSIS — R972 Elevated prostate specific antigen [PSA]: Secondary | ICD-10-CM | POA: Diagnosis not present

## 2021-12-20 LAB — BLADDER SCAN AMB NON-IMAGING: Scan Result: 44

## 2021-12-20 MED ORDER — TAMSULOSIN HCL 0.4 MG PO CAPS: 0.4000 mg | ORAL_CAPSULE | Freq: Every day | ORAL | 3 refills | Status: DC

## 2021-12-21 ENCOUNTER — Encounter: Payer: Self-pay | Admitting: Neurosurgery

## 2021-12-21 ENCOUNTER — Ambulatory Visit (INDEPENDENT_AMBULATORY_CARE_PROVIDER_SITE_OTHER): Payer: PPO | Admitting: Neurosurgery

## 2021-12-21 VITALS — BP 159/84 | HR 73 | Temp 97.9°F | Wt 161.0 lb

## 2021-12-21 DIAGNOSIS — S065XAD Traumatic subdural hemorrhage with loss of consciousness status unknown, subsequent encounter: Secondary | ICD-10-CM

## 2021-12-21 DIAGNOSIS — W19XXXD Unspecified fall, subsequent encounter: Secondary | ICD-10-CM

## 2021-12-21 DIAGNOSIS — Z09 Encounter for follow-up examination after completed treatment for conditions other than malignant neoplasm: Secondary | ICD-10-CM

## 2021-12-21 DIAGNOSIS — S065XAA Traumatic subdural hemorrhage with loss of consciousness status unknown, initial encounter: Secondary | ICD-10-CM

## 2021-12-21 NOTE — Progress Notes (Signed)
   REFERRING PHYSICIAN:  Lynnea Ferrier, Md [address] [facility] [place],  [place] [address]  DOS: Left frontal burr hole for drainage of subdural hematoma on 11/09/21.   HISTORY OF PRESENT ILLNESS: Robert Grimes is status post Left frontal burr hole for drainage of subdural hematoma.   He has occasional headaches.  He is doing much better than prior to surgery.   PHYSICAL EXAMINATION:  NEUROLOGICAL:  General: In no acute distress.   Awake, alert, oriented to person, place, and time.  Pupils equal round and reactive to light.  Facial tone is symmetric. Shoulder shrug is symmetric. Tongue protrusion is midline.  There is no pronator drift.   Strength: Side Biceps Triceps Deltoid Interossei Grip Wrist Ext. Wrist Flex.  R 5 5 5 5 5 5 5   L 5 5 5 5 5 5 5    Incisions c/d/i   Imaging:  CT Head 12/19/2021 IMPRESSION: Small residual mixed density subdural hematomas over both cerebral convexities, mildly decreased in size on the right and with resolved pneumocephalus on the left. A small amount of interval hemorrhage is possible on the left, however there is no significant mass effect.     Electronically Signed   By: M.D.   On: 12/19/2021 10:50  Assessment / Plan: Robert Grimes is doing well s/p above surgery.  I think he is doing much better.  He has a small amount of residual subdural fluid which should go away over time.  I will see him back if he has persistent symptoms.  I have released him to full activities.    12/21/2021 MD Dept of Neurosurgery

## 2021-12-21 NOTE — Progress Notes (Signed)
12/20/2021 7:45 AM   Robert Grimes 1950/04/30 [id number]  Referring provider: Lynnea Ferrier, MD [address] [facility] [place],  [place] [address]  Chief Complaint  Patient presents with   Elevated PSA    Urologic history:  1.  Elevated PSA Prostate biopsy early ~ 2005 for mild PSA elevation with benign pathology.  Records purged and records not available Prostate MRI in 2022; PSA 7.45; 66 g gland, no suspicious lesions Surveillance elected  2.  BPH with LUTS Seen 11/2021 with urinary retention with history of recurrent urinary retention.  Had declined tamsulosin  HPI: 71 y.o. male presents for follow-up visit.  He presents today with his son  Urgent care visit 11/24/2021 with dysuria Urinalysis with pyuria and urine culture grew Pseudomonas.  He agreed to start tamsulosin at that visit Significant improvement in his voiding pattern on tamsulosin when he desires to continue. Last PSA 04/2020 stable at 6.49   PMH: Past Medical History:  Diagnosis Date   CTS (carpal tunnel syndrome)    DDD (degenerative disc disease), lumbar    Elevated PSA    Hyperglycemia    Hyperlipidemia    Hypertension    OSA (obstructive sleep apnea)    Renal stones    Sleep apnea     Surgical History: Past Surgical History:  Procedure Laterality Date   BURR HOLE Left 11/09/2021   Procedure: BURR HOLES;  Surgeon: Venetia Night, MD;  Location: [facility] ORS;  Service: Neurosurgery;  Laterality: Left;   CATARACT EXTRACTION     CHOLECYSTECTOMY     HERNIA REPAIR     SPINE SURGERY      Home Medications:  Allergies as of 12/20/2021   No Known Allergies      Medication List        Accurate as of December 20, 2021 11:59 PM. If you have any questions, ask your nurse or doctor.          STOP taking these medications    ciprofloxacin 500 MG tablet Commonly known as: CIPRO Stopped by: Riki Altes, MD       TAKE these medications    amLODipine 2.5  MG tablet Commonly known as: NORVASC Take 2.5 mg by mouth in the morning and at bedtime.   dorzolamide-timolol 2-0.5 % ophthalmic solution Commonly known as: COSOPT Place 1 drop into both eyes 2 (two) times daily.   latanoprost 0.005 % ophthalmic solution Commonly known as: XALATAN Place 1 drop into both eyes at bedtime.   lisinopril-hydrochlorothiazide 20-12.5 MG tablet Commonly known as: ZESTORETIC Take 1 tablet by mouth daily.   rosuvastatin 5 MG tablet Commonly known as: CRESTOR Take 5 mg by mouth once a week.   tamsulosin 0.4 MG Caps capsule Commonly known as: FLOMAX Take 1 capsule (0.4 mg total) by mouth daily.        Allergies: No Known Allergies  Family History: History reviewed. No pertinent family history.  Social History:  reports that he has never smoked. He has never used smokeless tobacco. He reports that he does not drink alcohol. No history on file for drug use.   Physical Exam: BP (!) 148/79   Pulse 77   Ht 5\' 3"  (1.6 m)   Wt 162 lb (73.5 kg)   BMI 28.70 kg/m   Constitutional:  Alert and oriented, No acute distress. HEENT: [place] AT Respiratory: Normal respiratory effort, no increased work of breathing. Psychiatric: Normal mood and affect.    Assessment & Plan:  1.  BPH with LUTS PVR significant improved on tamsulosin at 44 mL Tamsulosin refilled Annual follow-up with PVR and instructed to call earlier for worsening voiding symptoms  2.  Elevated PSA Past due for a PSA.  He declined to have 1 drawn today and states he has a follow-up appointment with Dr. Graciela Husbands next month and will have it drawn then with his blood work   Riki Altes, MD  [facility] [address], [address] [place], [place] [address] [phone]

## 2021-12-26 ENCOUNTER — Encounter: Payer: PPO | Admitting: Occupational Therapy

## 2021-12-28 ENCOUNTER — Encounter: Payer: PPO | Admitting: Occupational Therapy

## 2021-12-28 DIAGNOSIS — H401121 Primary open-angle glaucoma, left eye, mild stage: Secondary | ICD-10-CM | POA: Diagnosis not present

## 2021-12-28 DIAGNOSIS — H401113 Primary open-angle glaucoma, right eye, severe stage: Secondary | ICD-10-CM | POA: Diagnosis not present

## 2022-01-02 ENCOUNTER — Encounter: Payer: PPO | Admitting: Occupational Therapy

## 2022-01-04 ENCOUNTER — Encounter: Payer: PPO | Admitting: Occupational Therapy

## 2022-01-08 ENCOUNTER — Encounter: Payer: PPO | Admitting: Occupational Therapy

## 2022-01-10 ENCOUNTER — Encounter: Payer: PPO | Admitting: Occupational Therapy

## 2022-01-15 ENCOUNTER — Encounter: Payer: PPO | Admitting: Occupational Therapy

## 2022-01-17 ENCOUNTER — Encounter: Payer: PPO | Admitting: Occupational Therapy

## 2022-01-22 ENCOUNTER — Encounter: Payer: PPO | Admitting: Occupational Therapy

## 2022-01-25 ENCOUNTER — Encounter: Payer: PPO | Admitting: Occupational Therapy

## 2022-01-29 ENCOUNTER — Encounter: Payer: PPO | Admitting: Occupational Therapy

## 2022-01-30 DIAGNOSIS — M5136 Other intervertebral disc degeneration, lumbar region: Secondary | ICD-10-CM | POA: Diagnosis not present

## 2022-01-30 DIAGNOSIS — E7849 Other hyperlipidemia: Secondary | ICD-10-CM | POA: Diagnosis not present

## 2022-01-30 DIAGNOSIS — R972 Elevated prostate specific antigen [PSA]: Secondary | ICD-10-CM | POA: Diagnosis not present

## 2022-01-30 DIAGNOSIS — G4733 Obstructive sleep apnea (adult) (pediatric): Secondary | ICD-10-CM | POA: Diagnosis not present

## 2022-01-30 DIAGNOSIS — R7303 Prediabetes: Secondary | ICD-10-CM | POA: Diagnosis not present

## 2022-02-01 ENCOUNTER — Encounter: Payer: PPO | Admitting: Occupational Therapy

## 2022-02-05 ENCOUNTER — Encounter: Payer: PPO | Admitting: Occupational Therapy

## 2022-02-06 DIAGNOSIS — N200 Calculus of kidney: Secondary | ICD-10-CM | POA: Diagnosis not present

## 2022-02-06 DIAGNOSIS — R7303 Prediabetes: Secondary | ICD-10-CM | POA: Diagnosis not present

## 2022-02-06 DIAGNOSIS — M5136 Other intervertebral disc degeneration, lumbar region: Secondary | ICD-10-CM | POA: Diagnosis not present

## 2022-02-06 DIAGNOSIS — I10 Essential (primary) hypertension: Secondary | ICD-10-CM | POA: Diagnosis not present

## 2022-02-06 DIAGNOSIS — E7849 Other hyperlipidemia: Secondary | ICD-10-CM | POA: Diagnosis not present

## 2022-02-06 DIAGNOSIS — Z2821 Immunization not carried out because of patient refusal: Secondary | ICD-10-CM | POA: Diagnosis not present

## 2022-02-06 DIAGNOSIS — Z8679 Personal history of other diseases of the circulatory system: Secondary | ICD-10-CM | POA: Diagnosis not present

## 2022-02-06 DIAGNOSIS — R972 Elevated prostate specific antigen [PSA]: Secondary | ICD-10-CM | POA: Diagnosis not present

## 2022-02-06 DIAGNOSIS — G4733 Obstructive sleep apnea (adult) (pediatric): Secondary | ICD-10-CM | POA: Diagnosis not present

## 2022-02-07 ENCOUNTER — Encounter: Payer: PPO | Admitting: Occupational Therapy

## 2022-02-12 ENCOUNTER — Encounter: Payer: PPO | Admitting: Occupational Therapy

## 2022-02-14 ENCOUNTER — Encounter: Payer: PPO | Admitting: Occupational Therapy

## 2022-02-19 ENCOUNTER — Encounter: Payer: PPO | Admitting: Occupational Therapy

## 2022-02-21 ENCOUNTER — Encounter: Payer: PPO | Admitting: Occupational Therapy

## 2022-02-26 ENCOUNTER — Encounter: Payer: PPO | Admitting: Occupational Therapy

## 2022-02-28 ENCOUNTER — Encounter: Payer: PPO | Admitting: Occupational Therapy

## 2022-03-05 ENCOUNTER — Encounter: Payer: PPO | Admitting: Occupational Therapy

## 2022-03-07 ENCOUNTER — Encounter: Payer: PPO | Admitting: Occupational Therapy

## 2022-03-12 ENCOUNTER — Encounter: Payer: PPO | Admitting: Occupational Therapy

## 2022-03-14 ENCOUNTER — Encounter: Payer: PPO | Admitting: Occupational Therapy

## 2022-05-22 DIAGNOSIS — M48062 Spinal stenosis, lumbar region with neurogenic claudication: Secondary | ICD-10-CM | POA: Diagnosis not present

## 2022-05-22 DIAGNOSIS — M5441 Lumbago with sciatica, right side: Secondary | ICD-10-CM | POA: Diagnosis not present

## 2022-05-22 DIAGNOSIS — M5442 Lumbago with sciatica, left side: Secondary | ICD-10-CM | POA: Diagnosis not present

## 2022-05-22 DIAGNOSIS — G8929 Other chronic pain: Secondary | ICD-10-CM | POA: Diagnosis not present

## 2022-06-05 DIAGNOSIS — M48062 Spinal stenosis, lumbar region with neurogenic claudication: Secondary | ICD-10-CM | POA: Diagnosis not present

## 2022-06-05 DIAGNOSIS — G8929 Other chronic pain: Secondary | ICD-10-CM | POA: Diagnosis not present

## 2022-06-05 DIAGNOSIS — M5442 Lumbago with sciatica, left side: Secondary | ICD-10-CM | POA: Diagnosis not present

## 2022-06-05 DIAGNOSIS — M5441 Lumbago with sciatica, right side: Secondary | ICD-10-CM | POA: Diagnosis not present

## 2022-06-26 DIAGNOSIS — H401113 Primary open-angle glaucoma, right eye, severe stage: Secondary | ICD-10-CM | POA: Diagnosis not present

## 2022-06-26 DIAGNOSIS — M48062 Spinal stenosis, lumbar region with neurogenic claudication: Secondary | ICD-10-CM | POA: Diagnosis not present

## 2022-06-26 DIAGNOSIS — H401121 Primary open-angle glaucoma, left eye, mild stage: Secondary | ICD-10-CM | POA: Diagnosis not present

## 2022-07-03 DIAGNOSIS — M48062 Spinal stenosis, lumbar region with neurogenic claudication: Secondary | ICD-10-CM | POA: Diagnosis not present

## 2022-07-10 DIAGNOSIS — M48062 Spinal stenosis, lumbar region with neurogenic claudication: Secondary | ICD-10-CM | POA: Diagnosis not present

## 2022-07-18 DIAGNOSIS — M48062 Spinal stenosis, lumbar region with neurogenic claudication: Secondary | ICD-10-CM | POA: Diagnosis not present

## 2022-07-26 DIAGNOSIS — M48062 Spinal stenosis, lumbar region with neurogenic claudication: Secondary | ICD-10-CM | POA: Diagnosis not present

## 2022-07-31 DIAGNOSIS — E7849 Other hyperlipidemia: Secondary | ICD-10-CM | POA: Diagnosis not present

## 2022-07-31 DIAGNOSIS — R7303 Prediabetes: Secondary | ICD-10-CM | POA: Diagnosis not present

## 2022-07-31 DIAGNOSIS — R972 Elevated prostate specific antigen [PSA]: Secondary | ICD-10-CM | POA: Diagnosis not present

## 2022-07-31 DIAGNOSIS — G4733 Obstructive sleep apnea (adult) (pediatric): Secondary | ICD-10-CM | POA: Diagnosis not present

## 2022-07-31 DIAGNOSIS — M5136 Other intervertebral disc degeneration, lumbar region: Secondary | ICD-10-CM | POA: Diagnosis not present

## 2022-08-07 DIAGNOSIS — N200 Calculus of kidney: Secondary | ICD-10-CM | POA: Diagnosis not present

## 2022-08-07 DIAGNOSIS — I10 Essential (primary) hypertension: Secondary | ICD-10-CM | POA: Diagnosis not present

## 2022-08-07 DIAGNOSIS — Z0000 Encounter for general adult medical examination without abnormal findings: Secondary | ICD-10-CM | POA: Diagnosis not present

## 2022-08-07 DIAGNOSIS — M5136 Other intervertebral disc degeneration, lumbar region: Secondary | ICD-10-CM | POA: Diagnosis not present

## 2022-08-07 DIAGNOSIS — E7849 Other hyperlipidemia: Secondary | ICD-10-CM | POA: Diagnosis not present

## 2022-08-07 DIAGNOSIS — R7303 Prediabetes: Secondary | ICD-10-CM | POA: Diagnosis not present

## 2022-08-07 DIAGNOSIS — G4733 Obstructive sleep apnea (adult) (pediatric): Secondary | ICD-10-CM | POA: Diagnosis not present

## 2022-08-07 DIAGNOSIS — Z8679 Personal history of other diseases of the circulatory system: Secondary | ICD-10-CM | POA: Diagnosis not present

## 2022-08-09 DIAGNOSIS — M48062 Spinal stenosis, lumbar region with neurogenic claudication: Secondary | ICD-10-CM | POA: Diagnosis not present

## 2022-08-16 DIAGNOSIS — M48062 Spinal stenosis, lumbar region with neurogenic claudication: Secondary | ICD-10-CM | POA: Diagnosis not present

## 2022-08-22 DIAGNOSIS — M48062 Spinal stenosis, lumbar region with neurogenic claudication: Secondary | ICD-10-CM | POA: Diagnosis not present

## 2022-08-28 DIAGNOSIS — M48062 Spinal stenosis, lumbar region with neurogenic claudication: Secondary | ICD-10-CM | POA: Diagnosis not present

## 2022-09-06 ENCOUNTER — Ambulatory Visit: Payer: PPO | Admitting: Urology

## 2022-09-11 DIAGNOSIS — M48062 Spinal stenosis, lumbar region with neurogenic claudication: Secondary | ICD-10-CM | POA: Diagnosis not present

## 2022-09-17 ENCOUNTER — Ambulatory Visit: Payer: PPO | Admitting: Urology

## 2022-09-17 ENCOUNTER — Encounter: Payer: Self-pay | Admitting: Urology

## 2022-09-17 VITALS — BP 129/72 | HR 61 | Ht 63.0 in | Wt 161.0 lb

## 2022-09-17 DIAGNOSIS — N401 Enlarged prostate with lower urinary tract symptoms: Secondary | ICD-10-CM

## 2022-09-17 DIAGNOSIS — R972 Elevated prostate specific antigen [PSA]: Secondary | ICD-10-CM

## 2022-09-17 NOTE — Progress Notes (Signed)
I, Robert Grimes, acting as a scribe for Robert Altes, MD., have documented all relevant documentation on the behalf of Robert Altes, MD, as directed by Robert Altes, MD while in the presence of Robert Altes, MD.  09/17/2022 2:08 PM   Robert Grimes [birth year] [id number]  Referring provider: Lynnea Ferrier, MD [address] [facility] [place],  [place] [address]  Chief Complaint  Patient presents with   Follow-up   Urologic history: 1.  Elevated PSA Prostate biopsy early ~ 2005 for mild PSA elevation with benign pathology.  Records purged and records not available Prostate MRI in 2022; PSA 7.45; 66 g gland, no suspicious lesions Surveillance elected   2.  BPH with LUTS Seen 11/2021 with urinary retention with history of recurrent urinary retention.  Had declined tamsulosin  HPI: Robert Grimes is a 72 y.o. male presents for follow-up visit for an elevated PSA.   PSA drawn by Dr. Odessa Grimes office 07/31/22 was elevated above baseline at 9.46 No bothersome lower urinary tract symptom.  Denies dysuria, gross hematuria.  No flank, abdominal, or pelvic pain.  Urinalysis performed at the time of his PSA was negative for infection.    PMH: Past Medical History:  Diagnosis Date   CTS (carpal tunnel syndrome)    DDD (degenerative disc disease), lumbar    Elevated PSA    Hyperglycemia    Hyperlipidemia    Hypertension    OSA (obstructive sleep apnea)    Renal stones    Sleep apnea     Surgical History: Past Surgical History:  Procedure Laterality Date   BURR HOLE Left 11/09/2021   Procedure: BURR HOLES;  Surgeon: Venetia Night, MD;  Location: [facility] ORS;  Service: Neurosurgery;  Laterality: Left;   CATARACT EXTRACTION     CHOLECYSTECTOMY     HERNIA REPAIR     SPINE SURGERY      Home Medications:  Allergies as of 09/17/2022   No Known Allergies      Medication List        Accurate as of September 17, 2022  2:08 PM. If you  have any questions, ask your nurse or doctor.          amLODipine 2.5 MG tablet Commonly known as: NORVASC Take 2.5 mg by mouth in the morning and at bedtime.   dorzolamide-timolol 2-0.5 % ophthalmic solution Commonly known as: COSOPT Place 1 drop into both eyes 2 (two) times daily.   gabapentin 100 MG capsule Commonly known as: NEURONTIN Take 100 mg by mouth 3 (three) times daily.   latanoprost 0.005 % ophthalmic solution Commonly known as: XALATAN Place 1 drop into both eyes at bedtime.   lisinopril-hydrochlorothiazide 20-12.5 MG tablet Commonly known as: ZESTORETIC Take 1 tablet by mouth daily.   rosuvastatin 5 MG tablet Commonly known as: CRESTOR Take 5 mg by mouth once a week.   tamsulosin 0.4 MG Caps capsule Commonly known as: FLOMAX Take 1 capsule (0.4 mg total) by mouth daily.        Allergies: No Known Allergies   Social History:  reports that he has never smoked. He has never used smokeless tobacco. He reports that he does not drink alcohol. No history on file for drug use.   Physical Exam: BP 129/72   Pulse 61   Ht 5\' 3"  (1.6 m)   Wt 161 lb (73 kg)   BMI 28.52 kg/m   Constitutional:  Alert and oriented, No acute  distress. HEENT: [place] AT, moist mucus membranes.  Trachea midline, no masses. Cardiovascular: No clubbing, cyanosis, or edema. Respiratory: Normal respiratory effort, no increased work of breathing. GI: Abdomen is soft, nontender, nondistended, no abdominal masses GU: Prostate 50 grams, smooth without nodules.  Skin: No rashes, bruises or suspicious lesions. Neurologic: Grossly intact, no focal deficits, moving all 4 extremities. Psychiatric: Normal mood and affect.   Assessment & Plan:    1. Elevated PSA We discussed possibility of transient PSA elevations secondary to inflammation and enlarged prostates.  We'll repeat PSA today.  If PSA persistent elevated above baseline we'll schedule repeat prostate MRI.  [facility] [address], [address] [place], [place] [address] [phone]

## 2022-09-18 ENCOUNTER — Encounter: Payer: Self-pay | Admitting: Urology

## 2022-09-18 DIAGNOSIS — M48062 Spinal stenosis, lumbar region with neurogenic claudication: Secondary | ICD-10-CM | POA: Diagnosis not present

## 2022-09-18 LAB — PSA: Prostate Specific Ag, Serum: 9.7 ng/mL — ABNORMAL HIGH (ref 0.0–4.0)

## 2022-09-19 ENCOUNTER — Other Ambulatory Visit: Payer: Self-pay | Admitting: *Deleted

## 2022-09-19 ENCOUNTER — Encounter: Payer: Self-pay | Admitting: *Deleted

## 2022-09-19 DIAGNOSIS — R972 Elevated prostate specific antigen [PSA]: Secondary | ICD-10-CM

## 2022-09-27 ENCOUNTER — Ambulatory Visit
Admission: RE | Admit: 2022-09-27 | Discharge: 2022-09-27 | Disposition: A | Discharge status: Home / Self Care | Payer: PPO | Source: Ambulatory Visit | Attending: Urology | Admitting: Urology

## 2022-09-27 DIAGNOSIS — R972 Elevated prostate specific antigen [PSA]: Secondary | ICD-10-CM | POA: Diagnosis not present

## 2022-09-27 DIAGNOSIS — N400 Enlarged prostate without lower urinary tract symptoms: Secondary | ICD-10-CM | POA: Diagnosis not present

## 2022-09-27 DIAGNOSIS — N4289 Other specified disorders of prostate: Secondary | ICD-10-CM | POA: Diagnosis not present

## 2022-09-27 MED ORDER — GADOBUTROL 1 MMOL/ML IV SOLN
7.0000 mL | Freq: Once | INTRAVENOUS | Status: AC | PRN
  Administered 2022-09-27: 7 mL via INTRAVENOUS

## 2022-09-28 ENCOUNTER — Encounter: Payer: Self-pay | Admitting: *Deleted

## 2022-10-12 ENCOUNTER — Ambulatory Visit: Payer: PPO | Admitting: Urology

## 2022-10-12 ENCOUNTER — Encounter: Payer: Self-pay | Admitting: Urology

## 2022-10-12 VITALS — BP 148/76 | HR 78 | Ht 63.0 in | Wt 161.0 lb

## 2022-10-12 DIAGNOSIS — R972 Elevated prostate specific antigen [PSA]: Secondary | ICD-10-CM

## 2022-10-12 DIAGNOSIS — C61 Malignant neoplasm of prostate: Secondary | ICD-10-CM | POA: Diagnosis not present

## 2022-10-12 DIAGNOSIS — Z2989 Encounter for other specified prophylactic measures: Secondary | ICD-10-CM

## 2022-10-12 MED ORDER — GENTAMICIN SULFATE 40 MG/ML IJ SOLN
80.0000 mg | Freq: Once | INTRAMUSCULAR | Status: AC
Start: 2022-10-12 — End: 2022-10-12
  Administered 2022-10-12: 80 mg via INTRAMUSCULAR

## 2022-10-12 MED ORDER — LEVOFLOXACIN 500 MG PO TABS
500.0000 mg | ORAL_TABLET | Freq: Once | ORAL | Status: AC
Start: 2022-10-12 — End: 2022-10-12
  Administered 2022-10-12: 500 mg via ORAL

## 2022-10-12 NOTE — Progress Notes (Signed)
   10/12/22  Indication: 72 year old male with a personal history of elevated PSA now rising PSA up to 9.7.  He has undergone biopsies in the past.  He underwent prostate MRI indicating 2 PI-RADS 4 lesions and as such, presents today for biopsy as recommended by Dr. Lonna Cobb.  MRI Fusion Prostate Biopsy Procedure   Informed consent was obtained, and we discussed the risks of bleeding and infection/sepsis. A time out was performed to ensure correct patient identity.  Pre-Procedure: - Last PSA Level: 9.7 - Gentamicin and levaquin given for antibiotic prophylaxis -Prostate measured 59.4 g on MRI, PSA density 0.16 - No significant hypoechoic or median lobe noted  Procedure: - Prostate block performed using 10 cc 1% lidocaine  - MRI fusion biopsy was performed, and 3 biopsies were taken from the ROI #1 : PI-RADS category 4 lesion of the right posteromedial peripheral zone in the mid gland and 3 taken from POI#2  PI-RADS category 4 lesion of the right posterolateral peripheral zone in the mid gland - Standard biopsies taken from sextant areas, 12 under ultrasound guidance. - Total of 18 cores taken  Post-Procedure: - Patient tolerated the procedure well - He was counseled to seek immediate medical attention if experiences significant bleeding, fevers, or severe pain - Return in one week to discuss biopsy results  Assessment/ Plan: Will follow up in 1-2 weeks to discuss pathology with Dr. Nash Mantis, MD

## 2022-10-12 NOTE — Patient Instructions (Signed)

## 2022-10-29 ENCOUNTER — Encounter: Payer: Self-pay | Admitting: Urology

## 2022-10-29 ENCOUNTER — Ambulatory Visit: Payer: PPO | Admitting: Urology

## 2022-10-29 VITALS — BP 156/83 | HR 73 | Ht 63.0 in | Wt 161.0 lb

## 2022-10-29 DIAGNOSIS — R351 Nocturia: Secondary | ICD-10-CM

## 2022-10-29 DIAGNOSIS — C61 Malignant neoplasm of prostate: Secondary | ICD-10-CM

## 2022-10-29 MED ORDER — OXYBUTYNIN CHLORIDE 5 MG PO TABS: 5.0000 mg | ORAL_TABLET | Freq: Every day | ORAL | 0 refills | Status: AC

## 2022-10-29 NOTE — Progress Notes (Signed)
I, Maysun Anabel Bene, acting as a scribe for Riki Altes, MD., have documented all relevant documentation on the behalf of Riki Altes, MD, as directed by Riki Altes, MD while in the presence of Riki Altes, MD.  10/29/2022 9:31 AM   [name] Oct 05, 1950 [id number]  Referring provider: Lynnea Ferrier, MD [address] [place],  [place] [address]  Urologic history: 1.  Elevated PSA Prostate biopsy early ~ 2005 for mild PSA elevation with benign pathology.  Records purged and records not available Prostate MRI in 2022; PSA 7.45; 66 g gland, no suspicious lesions Surveillance elected  2.  BPH with LUTS Seen 11/2021 with urinary retention with history of recurrent urinary retention.  Had declined tamsulosin   HPI: Robert Grimes is a 72 y.o. male presents for prostate biopsy follow-up.   Status post MR fusion biopsy 10/12/2022 for a PSA of 9.7 and prostate MRI showing a prostate volume of 59 cc's and PIRADS 4 lesions x2. He underwent 3 cores of each ROI+standard 12-core template.  No post-biopsy complaints.  Pathology: ROI number one showed focal Gleason 3+3 adenocarcinoma involving 1% of the submitted tissue; ROI number two and the standard 12-core template biopsies all showed benign prostate tissue  Patient also complains of nocturne x4. He is on tamsulosin.  No history of sleep apnea or snoring. One of his family members who is with him today, states he does not sleep well.   PMH: Past Medical History:  Diagnosis Date   CTS (carpal tunnel syndrome)    DDD (degenerative disc disease), lumbar    Elevated PSA    Hyperglycemia    Hyperlipidemia    Hypertension    OSA (obstructive sleep apnea)    Renal stones    Sleep apnea     Surgical History: Past Surgical History:  Procedure Laterality Date   BURR HOLE Left 11/09/2021   Procedure: BURR HOLES;  Surgeon: Venetia Night, MD;  Location: [facility] ORS;  Service:  Neurosurgery;  Laterality: Left;   CATARACT EXTRACTION     CHOLECYSTECTOMY     HERNIA REPAIR     SPINE SURGERY      Home Medications:  Allergies as of 10/29/2022   No Known Allergies      Medication List        Accurate as of October 29, 2022  9:31 AM. If you have any questions, ask your nurse or doctor.          amLODipine 2.5 MG tablet Commonly known as: NORVASC Take 2.5 mg by mouth in the morning and at bedtime.   dorzolamide-timolol 2-0.5 % ophthalmic solution Commonly known as: COSOPT Place 1 drop into both eyes 2 (two) times daily.   gabapentin 100 MG capsule Commonly known as: NEURONTIN Take 100 mg by mouth 3 (three) times daily.   latanoprost 0.005 % ophthalmic solution Commonly known as: XALATAN Place 1 drop into both eyes at bedtime.   lisinopril-hydrochlorothiazide 20-12.5 MG tablet Commonly known as: ZESTORETIC Take 1 tablet by mouth daily.   oxybutynin 5 MG tablet Commonly known as: DITROPAN Take 1 tablet (5 mg total) by mouth at bedtime. Started by: Riki Altes   rosuvastatin 5 MG tablet Commonly known as: CRESTOR Take 5 mg by mouth once a week.   tamsulosin 0.4 MG Caps capsule Commonly known as: FLOMAX Take 1 capsule (0.4 mg total) by mouth daily.        Allergies: No Known Allergies  Social History:  reports that he has never smoked. He has never used smokeless tobacco. He reports that he does not drink alcohol. No history on file for drug use.   Physical Exam: BP (!) 156/83   Pulse 73   Ht 5\' 3"  (1.6 m)   Wt 161 lb (73 kg)   BMI 28.52 kg/m   Constitutional:  Alert, No acute distress. HEENT: [place] AT Respiratory: Normal respiratory effort, no increased work of breathing. Psychiatric: Normal mood and affect.   Assessment & Plan:    1. 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 We discussed the rationale of active  surveillance, which involves Q6 month office visits with PSA and an annual DRE. We also discussed a confirmatory biopsy as recommended within 1-2 years of the initial biopsy.  Other options were discussed including RALP and radiation modalities; he desires to proceed with active surveillance and will schedule a 6 month office visit with PSA.    2. Nocturia New problem We discussed a common cause of nocturia is sleep apnea.  We discussed the possibility of BPH as a cause, though tamsulosin has not helped.  He was interested in a trial of immediate release of oxybutynin 5 mg 1 hour prior to bedtime x30 days. If this is not beneficial, I would recommend he follow up with his PCP to consider sleep apnea testing.   I have reviewed the above documentation for accuracy and completeness, and I agree with the above.   Riki Altes, MD  [facility] [address] [place], [place] [address] [phone]

## 2022-12-13 ENCOUNTER — Other Ambulatory Visit: Payer: Self-pay | Admitting: Urology

## 2022-12-17 ENCOUNTER — Other Ambulatory Visit: Payer: Self-pay | Admitting: Urology

## 2022-12-20 ENCOUNTER — Ambulatory Visit: Payer: PPO | Admitting: Urology

## 2023-01-22 DIAGNOSIS — H401113 Primary open-angle glaucoma, right eye, severe stage: Secondary | ICD-10-CM | POA: Diagnosis not present

## 2023-01-22 DIAGNOSIS — H401121 Primary open-angle glaucoma, left eye, mild stage: Secondary | ICD-10-CM | POA: Diagnosis not present

## 2023-02-06 DIAGNOSIS — M51369 Other intervertebral disc degeneration, lumbar region without mention of lumbar back pain or lower extremity pain: Secondary | ICD-10-CM | POA: Diagnosis not present

## 2023-02-06 DIAGNOSIS — Z0000 Encounter for general adult medical examination without abnormal findings: Secondary | ICD-10-CM | POA: Diagnosis not present

## 2023-02-06 DIAGNOSIS — N200 Calculus of kidney: Secondary | ICD-10-CM | POA: Diagnosis not present

## 2023-02-06 DIAGNOSIS — E7849 Other hyperlipidemia: Secondary | ICD-10-CM | POA: Diagnosis not present

## 2023-02-06 DIAGNOSIS — I10 Essential (primary) hypertension: Secondary | ICD-10-CM | POA: Diagnosis not present

## 2023-02-06 DIAGNOSIS — R7303 Prediabetes: Secondary | ICD-10-CM | POA: Diagnosis not present

## 2023-02-13 DIAGNOSIS — Z2821 Immunization not carried out because of patient refusal: Secondary | ICD-10-CM | POA: Diagnosis not present

## 2023-02-13 DIAGNOSIS — Z8679 Personal history of other diseases of the circulatory system: Secondary | ICD-10-CM | POA: Diagnosis not present

## 2023-02-13 DIAGNOSIS — I10 Essential (primary) hypertension: Secondary | ICD-10-CM | POA: Diagnosis not present

## 2023-02-13 DIAGNOSIS — M51362 Other intervertebral disc degeneration, lumbar region with discogenic back pain and lower extremity pain: Secondary | ICD-10-CM | POA: Diagnosis not present

## 2023-02-13 DIAGNOSIS — G4733 Obstructive sleep apnea (adult) (pediatric): Secondary | ICD-10-CM | POA: Diagnosis not present

## 2023-02-13 DIAGNOSIS — R7303 Prediabetes: Secondary | ICD-10-CM | POA: Diagnosis not present

## 2023-02-13 DIAGNOSIS — N200 Calculus of kidney: Secondary | ICD-10-CM | POA: Diagnosis not present

## 2023-02-13 DIAGNOSIS — E7849 Other hyperlipidemia: Secondary | ICD-10-CM | POA: Diagnosis not present

## 2023-02-13 DIAGNOSIS — Z1211 Encounter for screening for malignant neoplasm of colon: Secondary | ICD-10-CM | POA: Diagnosis not present

## 2023-02-13 DIAGNOSIS — C61 Malignant neoplasm of prostate: Secondary | ICD-10-CM | POA: Diagnosis not present

## 2023-02-26 DIAGNOSIS — Z1211 Encounter for screening for malignant neoplasm of colon: Secondary | ICD-10-CM | POA: Diagnosis not present

## 2023-03-04 LAB — EXTERNAL GENERIC LAB PROCEDURE: COLOGUARD: NEGATIVE

## 2023-03-04 LAB — COLOGUARD: COLOGUARD: NEGATIVE

## 2023-03-12 DIAGNOSIS — M5442 Lumbago with sciatica, left side: Secondary | ICD-10-CM | POA: Diagnosis not present

## 2023-03-12 DIAGNOSIS — G8929 Other chronic pain: Secondary | ICD-10-CM | POA: Diagnosis not present

## 2023-03-12 DIAGNOSIS — M5416 Radiculopathy, lumbar region: Secondary | ICD-10-CM | POA: Diagnosis not present

## 2023-03-12 DIAGNOSIS — M5441 Lumbago with sciatica, right side: Secondary | ICD-10-CM | POA: Diagnosis not present

## 2023-04-29 ENCOUNTER — Other Ambulatory Visit: Payer: Self-pay

## 2023-04-29 ENCOUNTER — Other Ambulatory Visit: Payer: Self-pay | Admitting: *Deleted

## 2023-04-29 DIAGNOSIS — C61 Malignant neoplasm of prostate: Secondary | ICD-10-CM

## 2023-04-30 LAB — PSA: Prostate Specific Ag, Serum: 10.2 ng/mL — ABNORMAL HIGH (ref 0.0–4.0)

## 2023-05-29 ENCOUNTER — Ambulatory Visit: Payer: Self-pay | Admitting: Urology

## 2023-05-29 ENCOUNTER — Encounter: Payer: Self-pay | Admitting: Urology

## 2023-05-29 VITALS — BP 137/80 | HR 68 | Ht 63.0 in | Wt 165.0 lb

## 2023-05-29 DIAGNOSIS — C61 Malignant neoplasm of prostate: Secondary | ICD-10-CM

## 2023-05-29 DIAGNOSIS — R351 Nocturia: Secondary | ICD-10-CM

## 2023-05-29 NOTE — Progress Notes (Signed)
 I, Maysun Jamey Mccallum, acting as a Neurosurgeon for Geraline Knapp, MD., have documented all relevant documentation on the behalf of Geraline Knapp, MD, as directed by Geraline Knapp, MD while in the presence of Geraline Knapp, MD.  Discussed the use of AI scribe software for clinical note transcription with the patient, who gave verbal consent to proceed.   05/29/2023 9:34 AM   Robert Grimes 08/13/1950 [id number]  Referring provider: Melchor Spoon, MD [address] [facility] [place],  [place] [address]  Chief Complaint  Patient presents with   Prostate Cancer   Urologic history: 1. 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  Records purged and records not available Prostate MRI in 2022; PSA 7.45; 66 g gland, no suspicious lesions Surveillance elected A prostate MRI 09/21/22; PSA bump to 9.7 which showed PI-RADS 4 lesions x2 Fusion biopsy with Gleason 3+3 adneocarcinoma. ROI #1 involving 1% of these submitted tissue.  2. BPH with LUTS Nocturia  HPI: Robert Grimes is a 73 y.o. male presents for a follow-up visit.  He was given a trial of immediate-release oxybutynin  at bedtime, which did not improve his nocturia. Family member is with him today, who states he does snore and does drink fluids up until bedtime. PSA 04/29/23 stable 10.2   PSA trend   Prostate Specific Ag, Serum  Latest Ref Rng 0.0 - 4.0 ng/mL  09/17/2022 9.7 (H)   04/29/2023 10.2 (H)      PMH: Past Medical History:  Diagnosis Date   CTS (carpal tunnel syndrome)    DDD (degenerative disc disease), lumbar    Elevated PSA    Hyperglycemia    Hyperlipidemia    Hypertension    OSA (obstructive sleep apnea)    Renal stones    Sleep apnea     Surgical History: Past Surgical History:  Procedure Laterality Date   BURR HOLE Left 11/09/2021   Procedure: BURR HOLES;  Surgeon: Jodeen Munch, MD;  Location: [facility] ORS;   Service: Neurosurgery;  Laterality: Left;   CATARACT EXTRACTION     CHOLECYSTECTOMY     HERNIA REPAIR     SPINE SURGERY      Home Medications:  Allergies as of 05/29/2023   No Known Allergies      Medication List        Accurate as of May 29, 2023  9:34 AM. If you have any questions, ask your nurse or doctor.          amLODipine 2.5 MG tablet Commonly known as: NORVASC Take 2.5 mg by mouth in the morning and at bedtime.   dorzolamide-timolol 2-0.5 % ophthalmic solution Commonly known as: COSOPT Place 1 drop into both eyes 2 (two) times daily.   gabapentin 100 MG capsule Commonly known as: NEURONTIN Take 100 mg by mouth 3 (three) times daily.   latanoprost 0.005 % ophthalmic solution Commonly known as: XALATAN Place 1 drop into both eyes at bedtime.   lisinopril -hydrochlorothiazide  20-12.5 MG tablet Commonly known as: ZESTORETIC  Take 1 tablet by mouth daily.   oxybutynin  5 MG tablet Commonly known as: DITROPAN  Take 1 tablet (5 mg total) by mouth at bedtime.   rosuvastatin 5 MG tablet Commonly known as: CRESTOR Take 5 mg by mouth once a week.   tamsulosin  0.4 MG Caps capsule Commonly known as: FLOMAX  Take 1 capsule by mouth once daily  Allergies: No Known Allergies  Social History:  reports that he has never smoked. He has never used smokeless tobacco. He reports that he does not drink alcohol . No history on file for drug use.   Physical Exam: BP 137/80   Pulse 68   Ht 5\' 3"  (1.6 m)   Wt 165 lb (74.8 kg)   BMI 29.23 kg/m   Constitutional:  Alert and oriented, No acute distress. HEENT: [place] AT Respiratory: Normal respiratory effort, no increased work of breathing. Neurologic: Grossly intact, no focal deficits, moving all 4 extremities. Psychiatric: Normal mood and affect.   Assessment & Plan:    1. T1c very low risk prostate cancer Stable PSA Desires to continue active surveillance.  Follow up PSA 6 months and office visit 1 year.    2. Nocturia We discussed sleep apnea is a common cause of nocturia, and if bothersome, I would recommend he discuss sleep study with PCP.  I have reviewed the above documentation for accuracy and completeness, and I agree with the above.   Geraline Knapp, MD  [facility] [address] [place], [place] [address] [phone]

## 2023-07-22 DIAGNOSIS — H401113 Primary open-angle glaucoma, right eye, severe stage: Secondary | ICD-10-CM | POA: Diagnosis not present

## 2023-08-09 DIAGNOSIS — E7849 Other hyperlipidemia: Secondary | ICD-10-CM | POA: Diagnosis not present

## 2023-08-09 DIAGNOSIS — N200 Calculus of kidney: Secondary | ICD-10-CM | POA: Diagnosis not present

## 2023-08-09 DIAGNOSIS — Z8679 Personal history of other diseases of the circulatory system: Secondary | ICD-10-CM | POA: Diagnosis not present

## 2023-08-09 DIAGNOSIS — I10 Essential (primary) hypertension: Secondary | ICD-10-CM | POA: Diagnosis not present

## 2023-08-09 DIAGNOSIS — R7303 Prediabetes: Secondary | ICD-10-CM | POA: Diagnosis not present

## 2023-08-16 DIAGNOSIS — E7849 Other hyperlipidemia: Secondary | ICD-10-CM | POA: Diagnosis not present

## 2023-08-16 DIAGNOSIS — R7303 Prediabetes: Secondary | ICD-10-CM | POA: Diagnosis not present

## 2023-08-16 DIAGNOSIS — C61 Malignant neoplasm of prostate: Secondary | ICD-10-CM | POA: Diagnosis not present

## 2023-08-16 DIAGNOSIS — I10 Essential (primary) hypertension: Secondary | ICD-10-CM | POA: Diagnosis not present

## 2023-08-16 DIAGNOSIS — Z1331 Encounter for screening for depression: Secondary | ICD-10-CM | POA: Diagnosis not present

## 2023-08-16 DIAGNOSIS — G4733 Obstructive sleep apnea (adult) (pediatric): Secondary | ICD-10-CM | POA: Diagnosis not present

## 2023-08-16 DIAGNOSIS — Z8679 Personal history of other diseases of the circulatory system: Secondary | ICD-10-CM | POA: Diagnosis not present

## 2023-08-16 DIAGNOSIS — Z0000 Encounter for general adult medical examination without abnormal findings: Secondary | ICD-10-CM | POA: Diagnosis not present

## 2023-08-16 DIAGNOSIS — M51362 Other intervertebral disc degeneration, lumbar region with discogenic back pain and lower extremity pain: Secondary | ICD-10-CM | POA: Diagnosis not present

## 2023-09-25 DIAGNOSIS — M5416 Radiculopathy, lumbar region: Secondary | ICD-10-CM | POA: Diagnosis not present

## 2023-09-25 DIAGNOSIS — M5441 Lumbago with sciatica, right side: Secondary | ICD-10-CM | POA: Diagnosis not present

## 2023-09-25 DIAGNOSIS — G8929 Other chronic pain: Secondary | ICD-10-CM | POA: Diagnosis not present

## 2023-09-25 DIAGNOSIS — M5442 Lumbago with sciatica, left side: Secondary | ICD-10-CM | POA: Diagnosis not present

## 2023-10-09 DIAGNOSIS — M5442 Lumbago with sciatica, left side: Secondary | ICD-10-CM | POA: Diagnosis not present

## 2023-10-09 DIAGNOSIS — M5416 Radiculopathy, lumbar region: Secondary | ICD-10-CM | POA: Diagnosis not present

## 2023-10-09 DIAGNOSIS — M5441 Lumbago with sciatica, right side: Secondary | ICD-10-CM | POA: Diagnosis not present

## 2023-10-09 DIAGNOSIS — G8929 Other chronic pain: Secondary | ICD-10-CM | POA: Diagnosis not present

## 2023-11-24 ENCOUNTER — Other Ambulatory Visit: Payer: Self-pay | Admitting: Urology

## 2023-12-02 ENCOUNTER — Other Ambulatory Visit

## 2023-12-03 ENCOUNTER — Other Ambulatory Visit

## 2023-12-03 DIAGNOSIS — C61 Malignant neoplasm of prostate: Secondary | ICD-10-CM | POA: Diagnosis not present

## 2023-12-04 LAB — PSA: Prostate Specific Ag, Serum: 11.5 ng/mL — ABNORMAL HIGH (ref 0.0–4.0)

## 2023-12-05 ENCOUNTER — Ambulatory Visit: Payer: Self-pay | Admitting: Urology

## 2023-12-06 NOTE — Progress Notes (Signed)
 Scheduled labs

## 2023-12-06 NOTE — Telephone Encounter (Signed)
 Pt returned call, I scheduled 3 month lab

## 2024-03-04 ENCOUNTER — Other Ambulatory Visit

## 2024-05-27 ENCOUNTER — Other Ambulatory Visit

## 2024-05-29 ENCOUNTER — Ambulatory Visit: Admitting: Urology
